# Patient Record
Sex: Male | Born: 1935 | Race: White | Hispanic: No | Marital: Married | Smoking: Former smoker
Health system: Southern US, Community
[De-identification: ages and names within clinical notes are randomized; demographics above are authoritative.]

## PROBLEM LIST (undated history)

## (undated) DIAGNOSIS — J449 Chronic obstructive pulmonary disease, unspecified: Secondary | ICD-10-CM

## (undated) DIAGNOSIS — D171 Benign lipomatous neoplasm of skin and subcutaneous tissue of trunk: Secondary | ICD-10-CM

## (undated) DIAGNOSIS — E119 Type 2 diabetes mellitus without complications: Secondary | ICD-10-CM

## (undated) DIAGNOSIS — F329 Major depressive disorder, single episode, unspecified: Secondary | ICD-10-CM

## (undated) DIAGNOSIS — Z87898 Personal history of other specified conditions: Secondary | ICD-10-CM

## (undated) DIAGNOSIS — K2980 Duodenitis without bleeding: Secondary | ICD-10-CM

## (undated) DIAGNOSIS — C801 Malignant (primary) neoplasm, unspecified: Secondary | ICD-10-CM

## (undated) DIAGNOSIS — Z8719 Personal history of other diseases of the digestive system: Secondary | ICD-10-CM

## (undated) DIAGNOSIS — G450 Vertebro-basilar artery syndrome: Secondary | ICD-10-CM

## (undated) DIAGNOSIS — K589 Irritable bowel syndrome without diarrhea: Secondary | ICD-10-CM

## (undated) DIAGNOSIS — J3089 Other allergic rhinitis: Secondary | ICD-10-CM

## (undated) DIAGNOSIS — J0191 Acute recurrent sinusitis, unspecified: Secondary | ICD-10-CM

## (undated) DIAGNOSIS — E785 Hyperlipidemia, unspecified: Secondary | ICD-10-CM

## (undated) DIAGNOSIS — R413 Other amnesia: Secondary | ICD-10-CM

## (undated) DIAGNOSIS — I2510 Atherosclerotic heart disease of native coronary artery without angina pectoris: Secondary | ICD-10-CM

## (undated) DIAGNOSIS — F32A Depression, unspecified: Secondary | ICD-10-CM

## (undated) DIAGNOSIS — Z950 Presence of cardiac pacemaker: Secondary | ICD-10-CM

## (undated) DIAGNOSIS — I509 Heart failure, unspecified: Secondary | ICD-10-CM

## (undated) DIAGNOSIS — E538 Deficiency of other specified B group vitamins: Secondary | ICD-10-CM

## (undated) DIAGNOSIS — I429 Cardiomyopathy, unspecified: Secondary | ICD-10-CM

## (undated) DIAGNOSIS — Z8639 Personal history of other endocrine, nutritional and metabolic disease: Secondary | ICD-10-CM

## (undated) DIAGNOSIS — D649 Anemia, unspecified: Secondary | ICD-10-CM

## (undated) DIAGNOSIS — H47019 Ischemic optic neuropathy, unspecified eye: Secondary | ICD-10-CM

## (undated) HISTORY — DX: Irritable bowel syndrome without diarrhea: K58.9

## (undated) HISTORY — DX: Cardiomyopathy, unspecified: I42.9

## (undated) HISTORY — DX: Personal history of other endocrine, nutritional and metabolic disease: Z86.39

## (undated) HISTORY — DX: Acute recurrent sinusitis, unspecified: J01.91

## (undated) HISTORY — DX: Personal history of other specified conditions: Z87.898

## (undated) HISTORY — DX: Other allergic rhinitis: J30.89

## (undated) HISTORY — PX: COLONOSCOPY: SHX174

## (undated) HISTORY — DX: Type 2 diabetes mellitus without complications: E11.9

## (undated) HISTORY — DX: Presence of cardiac pacemaker: Z95.0

## (undated) HISTORY — DX: Deficiency of other specified B group vitamins: E53.8

## (undated) HISTORY — DX: Benign lipomatous neoplasm of skin and subcutaneous tissue of trunk: D17.1

## (undated) HISTORY — DX: Vertebro-basilar artery syndrome: G45.0

## (undated) HISTORY — DX: Anemia, unspecified: D64.9

## (undated) HISTORY — DX: Duodenitis without bleeding: K29.80

## (undated) HISTORY — DX: Depression, unspecified: F32.A

## (undated) HISTORY — DX: Ischemic optic neuropathy, unspecified eye: H47.019

## (undated) HISTORY — DX: Other amnesia: R41.3

## (undated) HISTORY — PX: MOHS SURGERY: SHX181

## (undated) HISTORY — DX: Personal history of other diseases of the digestive system: Z87.19

## (undated) HISTORY — DX: Major depressive disorder, single episode, unspecified: F32.9

## (undated) HISTORY — DX: Hyperlipidemia, unspecified: E78.5

## (undated) HISTORY — DX: Atherosclerotic heart disease of native coronary artery without angina pectoris: I25.10

---

## 1990-05-02 HISTORY — PX: CARDIAC CATHETERIZATION: SHX172

## 1990-06-02 HISTORY — PX: ESOPHAGEAL DILATION: SHX303

## 2003-10-04 ENCOUNTER — Ambulatory Visit: Payer: Self-pay | Admitting: Gastroenterology

## 2004-07-17 ENCOUNTER — Ambulatory Visit: Payer: Self-pay | Admitting: Cardiology

## 2006-12-24 ENCOUNTER — Ambulatory Visit: Payer: Self-pay | Admitting: Gastroenterology

## 2007-01-05 ENCOUNTER — Other Ambulatory Visit: Payer: Self-pay

## 2007-01-05 ENCOUNTER — Inpatient Hospital Stay: Payer: Self-pay | Admitting: Internal Medicine

## 2008-03-31 ENCOUNTER — Ambulatory Visit: Payer: Self-pay | Admitting: Specialist

## 2008-07-27 HISTORY — PX: LIPOMA EXCISION: SHX5283

## 2008-09-11 ENCOUNTER — Inpatient Hospital Stay: Payer: Self-pay | Admitting: Internal Medicine

## 2009-04-01 ENCOUNTER — Ambulatory Visit: Payer: Self-pay

## 2009-04-29 ENCOUNTER — Ambulatory Visit: Payer: Self-pay | Admitting: General Practice

## 2009-05-06 ENCOUNTER — Ambulatory Visit: Payer: Self-pay | Admitting: General Practice

## 2009-05-06 HISTORY — PX: KNEE ARTHROSCOPY: SUR90

## 2011-09-04 ENCOUNTER — Encounter: Payer: Self-pay | Admitting: Otolaryngology

## 2011-10-02 ENCOUNTER — Encounter: Payer: Self-pay | Admitting: Otolaryngology

## 2011-11-02 ENCOUNTER — Encounter: Payer: Self-pay | Admitting: Otolaryngology

## 2012-01-02 HISTORY — PX: CATARACT EXTRACTION, BILATERAL: SHX1313

## 2013-04-27 ENCOUNTER — Emergency Department: Payer: Self-pay | Admitting: Emergency Medicine

## 2014-01-01 DIAGNOSIS — C801 Malignant (primary) neoplasm, unspecified: Secondary | ICD-10-CM

## 2014-01-01 HISTORY — DX: Malignant (primary) neoplasm, unspecified: C80.1

## 2016-01-18 ENCOUNTER — Other Ambulatory Visit: Payer: Self-pay | Admitting: Specialist

## 2016-01-18 DIAGNOSIS — R0602 Shortness of breath: Secondary | ICD-10-CM

## 2016-01-20 ENCOUNTER — Ambulatory Visit
Admission: RE | Admit: 2016-01-20 | Discharge: 2016-01-20 | Disposition: A | Discharge status: Home / Self Care | Payer: Medicare PPO | Source: Ambulatory Visit | Attending: Interventional Radiology | Admitting: Interventional Radiology

## 2016-01-20 ENCOUNTER — Ambulatory Visit
Admission: RE | Admit: 2016-01-20 | Discharge: 2016-01-20 | Disposition: A | Discharge status: Home / Self Care | Payer: Medicare PPO | Source: Ambulatory Visit | Attending: Specialist | Admitting: Specialist

## 2016-01-20 DIAGNOSIS — J9811 Atelectasis: Secondary | ICD-10-CM | POA: Insufficient documentation

## 2016-01-20 DIAGNOSIS — J90 Pleural effusion, not elsewhere classified: Secondary | ICD-10-CM | POA: Diagnosis not present

## 2016-01-20 DIAGNOSIS — Z9889 Other specified postprocedural states: Secondary | ICD-10-CM | POA: Diagnosis not present

## 2016-01-20 DIAGNOSIS — R0602 Shortness of breath: Secondary | ICD-10-CM | POA: Diagnosis not present

## 2016-01-20 DIAGNOSIS — I517 Cardiomegaly: Secondary | ICD-10-CM | POA: Diagnosis not present

## 2016-01-20 LAB — PROTEIN, BODY FLUID: Total protein, fluid: <3 g/dL

## 2016-01-20 LAB — GLUCOSE, SEROUS FLUID: Glucose, Fluid: 128 mg/dL

## 2016-01-20 LAB — LACTATE DEHYDROGENASE, PLEURAL OR PERITONEAL FLUID: LD FL: 44 U/L — AB (ref 3–23)

## 2016-01-21 LAB — BODY FLUID CELL COUNT WITH DIFFERENTIAL
Eos, Fluid: 0 %
LYMPHS FL: 81 %
Monocyte-Macrophage-Serous Fluid: 15 %
NEUTROPHIL FLUID: 4 %
Total Nucleated Cell Count, Fluid: 227 cu mm

## 2016-01-21 LAB — MISC LABCORP TEST (SEND OUT): LABCORP TEST CODE: 19588

## 2016-01-23 LAB — BODY FLUID CULTURE: Culture: NO GROWTH

## 2016-01-24 LAB — CYTOLOGY - NON PAP

## 2016-02-17 LAB — FUNGAL ORGANISM REFLEX

## 2016-02-17 LAB — FUNGUS CULTURE RESULT

## 2016-02-17 LAB — FUNGUS CULTURE WITH STAIN

## 2016-03-14 LAB — ACID FAST SMEAR (AFB, MYCOBACTERIA): Acid Fast Smear: NEGATIVE

## 2016-03-14 LAB — ACID FAST CULTURE WITH REFLEXED SENSITIVITIES (MYCOBACTERIA): Acid Fast Culture: NEGATIVE

## 2016-08-17 DIAGNOSIS — Z950 Presence of cardiac pacemaker: Secondary | ICD-10-CM

## 2016-08-17 HISTORY — DX: Presence of cardiac pacemaker: Z95.0

## 2016-12-10 ENCOUNTER — Inpatient Hospital Stay
Admission: EM | Admit: 2016-12-10 | Discharge: 2016-12-13 | DRG: 536 | Disposition: A | Discharge status: Skilled Nursing Facility | Payer: Medicare PPO | Attending: Internal Medicine | Admitting: Internal Medicine

## 2016-12-10 ENCOUNTER — Encounter: Payer: Self-pay | Admitting: Emergency Medicine

## 2016-12-10 ENCOUNTER — Emergency Department: Payer: Medicare PPO

## 2016-12-10 ENCOUNTER — Other Ambulatory Visit: Payer: Self-pay

## 2016-12-10 DIAGNOSIS — W010XXA Fall on same level from slipping, tripping and stumbling without subsequent striking against object, initial encounter: Secondary | ICD-10-CM | POA: Diagnosis present

## 2016-12-10 DIAGNOSIS — Z7984 Long term (current) use of oral hypoglycemic drugs: Secondary | ICD-10-CM

## 2016-12-10 DIAGNOSIS — S5002XA Contusion of left elbow, initial encounter: Secondary | ICD-10-CM | POA: Diagnosis present

## 2016-12-10 DIAGNOSIS — S32412A Displaced fracture of anterior wall of left acetabulum, initial encounter for closed fracture: Secondary | ICD-10-CM | POA: Diagnosis not present

## 2016-12-10 DIAGNOSIS — T50995A Adverse effect of other drugs, medicaments and biological substances, initial encounter: Secondary | ICD-10-CM | POA: Diagnosis present

## 2016-12-10 DIAGNOSIS — M542 Cervicalgia: Secondary | ICD-10-CM | POA: Diagnosis present

## 2016-12-10 DIAGNOSIS — S5001XA Contusion of right elbow, initial encounter: Secondary | ICD-10-CM | POA: Diagnosis present

## 2016-12-10 DIAGNOSIS — I952 Hypotension due to drugs: Secondary | ICD-10-CM | POA: Diagnosis present

## 2016-12-10 DIAGNOSIS — J449 Chronic obstructive pulmonary disease, unspecified: Secondary | ICD-10-CM | POA: Diagnosis present

## 2016-12-10 DIAGNOSIS — E860 Dehydration: Secondary | ICD-10-CM | POA: Diagnosis present

## 2016-12-10 DIAGNOSIS — E119 Type 2 diabetes mellitus without complications: Secondary | ICD-10-CM | POA: Diagnosis present

## 2016-12-10 DIAGNOSIS — I9589 Other hypotension: Secondary | ICD-10-CM | POA: Diagnosis present

## 2016-12-10 DIAGNOSIS — S32435A Nondisplaced fracture of anterior column [iliopubic] of left acetabulum, initial encounter for closed fracture: Secondary | ICD-10-CM

## 2016-12-10 DIAGNOSIS — I5022 Chronic systolic (congestive) heart failure: Secondary | ICD-10-CM | POA: Diagnosis present

## 2016-12-10 DIAGNOSIS — R51 Headache: Secondary | ICD-10-CM | POA: Diagnosis present

## 2016-12-10 DIAGNOSIS — S72009A Fracture of unspecified part of neck of unspecified femur, initial encounter for closed fracture: Secondary | ICD-10-CM | POA: Diagnosis present

## 2016-12-10 DIAGNOSIS — Z79899 Other long term (current) drug therapy: Secondary | ICD-10-CM | POA: Diagnosis not present

## 2016-12-10 DIAGNOSIS — S32592A Other specified fracture of left pubis, initial encounter for closed fracture: Secondary | ICD-10-CM | POA: Diagnosis present

## 2016-12-10 DIAGNOSIS — I110 Hypertensive heart disease with heart failure: Secondary | ICD-10-CM | POA: Diagnosis present

## 2016-12-10 DIAGNOSIS — J9611 Chronic respiratory failure with hypoxia: Secondary | ICD-10-CM | POA: Diagnosis present

## 2016-12-10 DIAGNOSIS — M25512 Pain in left shoulder: Secondary | ICD-10-CM | POA: Diagnosis present

## 2016-12-10 DIAGNOSIS — Z9181 History of falling: Secondary | ICD-10-CM

## 2016-12-10 DIAGNOSIS — I428 Other cardiomyopathies: Secondary | ICD-10-CM | POA: Diagnosis present

## 2016-12-10 DIAGNOSIS — E871 Hypo-osmolality and hyponatremia: Secondary | ICD-10-CM | POA: Diagnosis present

## 2016-12-10 DIAGNOSIS — Z9981 Dependence on supplemental oxygen: Secondary | ICD-10-CM

## 2016-12-10 DIAGNOSIS — R531 Weakness: Secondary | ICD-10-CM

## 2016-12-10 DIAGNOSIS — R296 Repeated falls: Secondary | ICD-10-CM | POA: Diagnosis present

## 2016-12-10 HISTORY — DX: Heart failure, unspecified: I50.9

## 2016-12-10 HISTORY — DX: Type 2 diabetes mellitus without complications: E11.9

## 2016-12-10 HISTORY — DX: Chronic obstructive pulmonary disease, unspecified: J44.9

## 2016-12-10 HISTORY — DX: Malignant (primary) neoplasm, unspecified: C80.1

## 2016-12-10 LAB — CBC WITH DIFFERENTIAL/PLATELET
BASOS PCT: 0 %
Basophils Absolute: 0 10*3/uL (ref 0–0.1)
EOS ABS: 0.1 10*3/uL (ref 0–0.7)
EOS PCT: 2 %
HCT: 29.7 % — ABNORMAL LOW (ref 40.0–52.0)
Hemoglobin: 9.8 g/dL — ABNORMAL LOW (ref 13.0–18.0)
Lymphocytes Relative: 4 %
Lymphs Abs: 0.3 10*3/uL — ABNORMAL LOW (ref 1.0–3.6)
MCH: 29.7 pg (ref 26.0–34.0)
MCHC: 33 g/dL (ref 32.0–36.0)
MCV: 90 fL (ref 80.0–100.0)
MONO ABS: 0.3 10*3/uL (ref 0.2–1.0)
MONOS PCT: 4 %
NEUTROS PCT: 90 %
Neutro Abs: 7.7 10*3/uL — ABNORMAL HIGH (ref 1.4–6.5)
PLATELETS: 129 10*3/uL — AB (ref 150–440)
RBC: 3.3 MIL/uL — ABNORMAL LOW (ref 4.40–5.90)
RDW: 14.8 % — AB (ref 11.5–14.5)
WBC: 8.5 10*3/uL (ref 3.8–10.6)

## 2016-12-10 LAB — CK: CK TOTAL: 33 U/L — AB (ref 49–397)

## 2016-12-10 LAB — URINALYSIS, COMPLETE (UACMP) WITH MICROSCOPIC
BILIRUBIN URINE: NEGATIVE
Bacteria, UA: NONE SEEN
Glucose, UA: >=500 mg/dL — AB
Hgb urine dipstick: NEGATIVE
KETONES UR: NEGATIVE mg/dL
Leukocytes, UA: NEGATIVE
Nitrite: NEGATIVE
PH: 5 (ref 5.0–8.0)
Protein, ur: NEGATIVE mg/dL
SPECIFIC GRAVITY, URINE: 1.008 (ref 1.005–1.030)
Squamous Epithelial / LPF: NONE SEEN

## 2016-12-10 LAB — COMPREHENSIVE METABOLIC PANEL
ALBUMIN: 3.7 g/dL (ref 3.5–5.0)
ALT: 16 U/L — ABNORMAL LOW (ref 17–63)
ANION GAP: 11 (ref 5–15)
AST: 21 U/L (ref 15–41)
Alkaline Phosphatase: 50 U/L (ref 38–126)
BUN: 23 mg/dL — ABNORMAL HIGH (ref 6–20)
CO2: 20 mmol/L — AB (ref 22–32)
Calcium: 8.8 mg/dL — ABNORMAL LOW (ref 8.9–10.3)
Chloride: 96 mmol/L — ABNORMAL LOW (ref 101–111)
Creatinine, Ser: 1.41 mg/dL — ABNORMAL HIGH (ref 0.61–1.24)
GFR calc Af Amer: 53 mL/min — ABNORMAL LOW (ref >60)
GFR calc non Af Amer: 46 mL/min — ABNORMAL LOW (ref >60)
Glucose, Bld: 225 mg/dL — ABNORMAL HIGH (ref 65–99)
POTASSIUM: 4.3 mmol/L (ref 3.5–5.1)
SODIUM: 127 mmol/L — AB (ref 135–145)
Total Bilirubin: 0.4 mg/dL (ref 0.3–1.2)
Total Protein: 6.5 g/dL (ref 6.5–8.1)

## 2016-12-10 LAB — LACTIC ACID, PLASMA: Lactic Acid, Venous: 2.2 mmol/L (ref 0.5–1.9)

## 2016-12-10 LAB — GLUCOSE, CAPILLARY: Glucose-Capillary: 105 mg/dL — ABNORMAL HIGH (ref 65–99)

## 2016-12-10 LAB — SURGICAL PCR SCREEN
MRSA, PCR: NEGATIVE
STAPHYLOCOCCUS AUREUS: NEGATIVE

## 2016-12-10 LAB — TROPONIN I

## 2016-12-10 MED ORDER — PANTOPRAZOLE SODIUM 40 MG PO TBEC: 40.0000 mg | DELAYED_RELEASE_TABLET | Freq: Every day | ORAL | Status: DC

## 2016-12-10 MED ORDER — ACETAMINOPHEN 650 MG RE SUPP: 650.0000 mg | Freq: Four times a day (QID) | RECTAL | Status: DC | PRN

## 2016-12-10 MED ORDER — PANTOPRAZOLE SODIUM 40 MG PO TBEC
40.0000 mg | DELAYED_RELEASE_TABLET | Freq: Every day | ORAL | Status: DC
  Administered 2016-12-10: 40 mg via ORAL
  Filled 2016-12-10: qty 1

## 2016-12-10 MED ORDER — POTASSIUM CHLORIDE CRYS ER 10 MEQ PO TBCR
10.0000 meq | EXTENDED_RELEASE_TABLET | Freq: Two times a day (BID) | ORAL | Status: DC
  Administered 2016-12-10 – 2016-12-11 (×3): 10 meq via ORAL
  Filled 2016-12-10 (×3): qty 1

## 2016-12-10 MED ORDER — POLYETHYLENE GLYCOL 3350 17 G PO PACK
17.0000 g | PACK | Freq: Every day | ORAL | Status: DC | PRN
  Administered 2016-12-13: 17 g via ORAL
  Filled 2016-12-10: qty 1

## 2016-12-10 MED ORDER — SODIUM CHLORIDE 0.9 % IV BOLUS (SEPSIS)
250.0000 mL | Freq: Once | INTRAVENOUS | Status: AC
  Administered 2016-12-10: 250 mL via INTRAVENOUS

## 2016-12-10 MED ORDER — METOPROLOL SUCCINATE ER 50 MG PO TB24
50.0000 mg | ORAL_TABLET | Freq: Every day | ORAL | Status: DC
  Administered 2016-12-10: 50 mg via ORAL
  Filled 2016-12-10 (×2): qty 1

## 2016-12-10 MED ORDER — FLUTICASONE PROPIONATE 50 MCG/ACT NA SUSP
2.0000 | Freq: Every day | NASAL | Status: DC
  Administered 2016-12-11 – 2016-12-12 (×2): 2 via NASAL
  Filled 2016-12-10: qty 16

## 2016-12-10 MED ORDER — MORPHINE SULFATE (PF) 2 MG/ML IV SOLN: 1.0000 mg | INTRAVENOUS | Status: DC | PRN

## 2016-12-10 MED ORDER — ACETAMINOPHEN 325 MG PO TABS
650.0000 mg | ORAL_TABLET | Freq: Four times a day (QID) | ORAL | Status: DC | PRN
  Administered 2016-12-11 – 2016-12-13 (×3): 650 mg via ORAL
  Filled 2016-12-10 (×3): qty 2

## 2016-12-10 MED ORDER — LATANOPROST 0.005 % OP SOLN
1.0000 [drp] | Freq: Every day | OPHTHALMIC | Status: DC
  Administered 2016-12-10 – 2016-12-12 (×3): 1 [drp] via OPHTHALMIC
  Filled 2016-12-10: qty 2.5

## 2016-12-10 MED ORDER — OXYCODONE HCL 5 MG PO TABS
5.0000 mg | ORAL_TABLET | ORAL | Status: DC | PRN
  Administered 2016-12-10 – 2016-12-13 (×9): 5 mg via ORAL
  Filled 2016-12-10 (×9): qty 1

## 2016-12-10 MED ORDER — FLUTICASONE PROPIONATE 50 MCG/ACT NA SUSP
1.0000 | Freq: Every day | NASAL | Status: DC | PRN
  Filled 2016-12-10: qty 16

## 2016-12-10 MED ORDER — ONDANSETRON HCL 4 MG/2ML IJ SOLN: 4.0000 mg | Freq: Four times a day (QID) | INTRAMUSCULAR | Status: DC | PRN

## 2016-12-10 MED ORDER — CLONAZEPAM 0.5 MG PO TABS
0.5000 mg | ORAL_TABLET | Freq: Two times a day (BID) | ORAL | Status: DC | PRN
  Administered 2016-12-10 – 2016-12-12 (×2): 0.5 mg via ORAL
  Filled 2016-12-10 (×2): qty 1

## 2016-12-10 MED ORDER — SODIUM CHLORIDE 0.9 % IV SOLN
INTRAVENOUS | Status: AC
  Administered 2016-12-10: 75 mL/h via INTRAVENOUS
  Administered 2016-12-11: 13:00:00 via INTRAVENOUS

## 2016-12-10 MED ORDER — ONDANSETRON HCL 4 MG PO TABS: 4.0000 mg | ORAL_TABLET | Freq: Four times a day (QID) | ORAL | Status: DC | PRN

## 2016-12-10 MED ORDER — ALBUTEROL SULFATE (2.5 MG/3ML) 0.083% IN NEBU: 3.0000 mL | INHALATION_SOLUTION | Freq: Four times a day (QID) | RESPIRATORY_TRACT | Status: DC | PRN

## 2016-12-10 MED ORDER — ACETAMINOPHEN 10 MG/ML IV SOLN
1000.0000 mg | Freq: Four times a day (QID) | INTRAVENOUS | Status: AC
  Administered 2016-12-10 – 2016-12-11 (×4): 1000 mg via INTRAVENOUS
  Filled 2016-12-10 (×8): qty 100

## 2016-12-10 MED ORDER — DOCUSATE SODIUM 100 MG PO CAPS
100.0000 mg | ORAL_CAPSULE | Freq: Two times a day (BID) | ORAL | Status: DC
  Administered 2016-12-10 – 2016-12-13 (×5): 100 mg via ORAL
  Filled 2016-12-10 (×5): qty 1

## 2016-12-10 MED ORDER — METOPROLOL SUCCINATE ER 50 MG PO TB24: 50.0000 mg | ORAL_TABLET | Freq: Every day | ORAL | Status: DC

## 2016-12-10 MED ORDER — LISINOPRIL 5 MG PO TABS
5.0000 mg | ORAL_TABLET | Freq: Every day | ORAL | Status: DC
  Administered 2016-12-10 – 2016-12-12 (×3): 5 mg via ORAL
  Filled 2016-12-10 (×4): qty 1

## 2016-12-10 MED ORDER — SERTRALINE HCL 50 MG PO TABS: 50.0000 mg | ORAL_TABLET | Freq: Every day | ORAL | Status: DC

## 2016-12-10 MED ORDER — MOMETASONE FURO-FORMOTEROL FUM 200-5 MCG/ACT IN AERO
2.0000 | INHALATION_SPRAY | Freq: Two times a day (BID) | RESPIRATORY_TRACT | Status: DC
  Administered 2016-12-10 – 2016-12-12 (×4): 2 via RESPIRATORY_TRACT
  Filled 2016-12-10: qty 8.8

## 2016-12-10 MED ORDER — SIMVASTATIN 20 MG PO TABS
40.0000 mg | ORAL_TABLET | Freq: Every day | ORAL | Status: DC
  Administered 2016-12-10 – 2016-12-12 (×3): 40 mg via ORAL
  Filled 2016-12-10 (×3): qty 2

## 2016-12-10 MED ORDER — SPIRONOLACTONE 25 MG PO TABS
25.0000 mg | ORAL_TABLET | Freq: Every day | ORAL | Status: DC
  Administered 2016-12-11: 25 mg via ORAL
  Filled 2016-12-10: qty 1

## 2016-12-10 MED ORDER — LISINOPRIL 5 MG PO TABS: 5.0000 mg | ORAL_TABLET | Freq: Every day | ORAL | Status: DC

## 2016-12-10 MED ORDER — SERTRALINE HCL 50 MG PO TABS
50.0000 mg | ORAL_TABLET | Freq: Every day | ORAL | Status: DC
  Administered 2016-12-10 – 2016-12-12 (×3): 50 mg via ORAL
  Filled 2016-12-10 (×3): qty 1

## 2016-12-10 NOTE — ED Triage Notes (Signed)
Pt ems from home s/p fall last night. Pt c/o left hip pain only with movement. Per ems legs are equal length but here left leg seems longer. Pt states he has been weaker and has been falling because his legs give out. Pt a/o. On 2 LNC all the time.

## 2016-12-10 NOTE — ED Provider Notes (Addendum)
[facility] Emergency Department Provider Note    First MD Initiated Contact with Patient 12/10/16 1413     (approximate)  I have reviewed the triage vital signs and the nursing notes.   HISTORY  Chief Complaint Fall and Hip Pain    HPI Ruben Flores is a 81 y.o. male with a history of COPD as well as CHF on 2 L nasal cannula at home for chronic respiratory failure with hypoxia with no recent admissions presents after mechanical fall around 1 AM last night.  Patient states he was walking the restroom and his left leg gave out and he was unable to ambulate.  Did have some left leg pain.  He was able to crawl to another room and call for his wife.  Spent most of the early morning laying on the floor.  States he has been feeling more weak.  Denies any numbness or tingling.  He is not on any blood thinners.  Did not hit his head.  Does have some mild left shoulder pain where he also fell.  No recent antibiotic use.  No abdominal pain, shortness of breath or chest pain.  Past Medical History:  Diagnosis Date  . Cancer ([place]) 2016   skin  . CHF (congestive heart failure) ([place])   . COPD (chronic obstructive pulmonary disease) ([place])   . Diabetes mellitus without complication ([place])    History reviewed. No pertinent family history. History reviewed. No pertinent surgical history. There are no active problems to display for this patient.     Prior to Admission medications   Medication Sig Start Date End Date Taking? Authorizing Provider  albuterol (PROVENTIL HFA;VENTOLIN HFA) 108 (90 Base) MCG/ACT inhaler Inhale 2 puffs into the lungs every 6 (six) hours as needed for wheezing or shortness of breath.   Yes [provider]  clonazePAM (KLONOPIN) 0.5 MG tablet Take 0.5 mg by mouth 2 (two) times daily as needed for anxiety.   Yes [provider]  fluticasone (FLONASE) 50 MCG/ACT nasal spray Place 2 sprays into both nostrils daily.   Yes [provider]  fluticasone-salmeterol (ADVAIR HFA) 230-21 MCG/ACT inhaler Inhale 2 puffs into the lungs 2 (two) times daily.   Yes [provider]  furosemide (LASIX) 40 MG tablet Take 40 mg by mouth daily.   Yes [provider]  latanoprost (XALATAN) 0.005 % ophthalmic solution Place 1 drop into both eyes at bedtime.   Yes [provider]  lisinopril (PRINIVIL,ZESTRIL) 5 MG tablet Take 5 mg by mouth daily.   Yes [provider]  metFORMIN (GLUCOPHAGE) 500 MG tablet Take 1,000 mg by mouth daily.   Yes [provider]  metoprolol succinate (TOPROL-XL) 50 MG 24 hr tablet Take 50 mg by mouth daily. Take with or immediately following a meal.   Yes [provider]  omeprazole (PRILOSEC) 20 MG capsule Take 20 mg by mouth daily.   Yes [provider]  potassium chloride (K-DUR,KLOR-CON) 10 MEQ tablet Take 10 mEq by mouth 2 (two) times daily.   Yes [provider]  sertraline (ZOLOFT) 50 MG tablet Take 50 mg by mouth daily.   Yes [provider]  simvastatin (ZOCOR) 40 MG tablet Take 40 mg by mouth daily.   Yes [provider]  spironolactone (ALDACTONE) 25 MG tablet Take 25 mg by mouth daily.   Yes [provider]    Allergies Patient has no known allergies.    Social History Social History  Tobacco Use  . Smoking status: Never Smoker  . Smokeless tobacco: Never Used  Substance Use Topics  . Alcohol use: No    Frequency: Never  . Drug use: Not on file    Review of Systems Patient denies headaches, rhinorrhea, blurry vision, numbness, shortness of breath, chest pain, edema, cough, abdominal pain, nausea, vomiting, diarrhea, dysuria, fevers, rashes or hallucinations unless otherwise stated above in HPI. ____________________________________________   PHYSICAL EXAM:  VITAL SIGNS: Vitals:   12/10/16 1600 12/10/16 1644  BP: 108/60 121/70  Pulse: 80 77  Resp: 15 13  Temp:    SpO2: 100%  98%    Constitutional: Alert and oriented.  in no acute distress. Eyes: Conjunctivae are normal.  Head: Atraumatic. Nose: No congestion/rhinnorhea. Mouth/Throat: Mucous membranes are moist.   Neck: No stridor. Painless ROM.  Cardiovascular: Normal rate, regular rhythm. Grossly normal heart sounds.  Good peripheral circulation. Respiratory: Normal respiratory effort.  No retractions. Lungs CTAB. Gastrointestinal: Soft and nontender. No distention. No abdominal bruits. No CVA tenderness. Musculoskeletal: .  No deformity or bruising noted to left shoulder.  pain with log roll of left hip, left leg is slightly longer that right.  No effusions or other noted deformity Neurologic:  Normal speech and language. No gross focal neurologic deficits are appreciated. No facial droop Skin:  Skin is warm, dry and intact. No rash noted. Psychiatric: Mood and affect are normal. Speech and behavior are normal.  ____________________________________________   LABS (all labs ordered are listed, but only abnormal results are displayed)  Results for orders placed or performed during the hospital encounter of 12/10/16 (from the past 24 hour(s))  CBC with Differential/Platelet     Status: Abnormal   Collection Time: 12/10/16  2:26 PM  Result Value Ref Range   WBC 8.5 3.8 - 10.6 K/uL   RBC 3.30 (L) 4.40 - 5.90 MIL/uL   Hemoglobin 9.8 (L) 13.0 - 18.0 g/dL   HCT 29.7 (L) 40.0 - 52.0 %   MCV 90.0 80.0 - 100.0 fL   MCH 29.7 26.0 - 34.0 pg   MCHC 33.0 32.0 - 36.0 g/dL   RDW 14.8 (H) 11.5 - 14.5 %   Platelets 129 (L) 150 - 440 K/uL   Neutrophils Relative % 90 %   Neutro Abs 7.7 (H) 1.4 - 6.5 K/uL   Lymphocytes Relative 4 %   Lymphs Abs 0.3 (L) 1.0 - 3.6 K/uL   Monocytes Relative 4 %   Monocytes Absolute 0.3 0.2 - 1.0 K/uL   Eosinophils Relative 2 %   Eosinophils Absolute 0.1 0 - 0.7 K/uL   Basophils Relative 0 %   Basophils Absolute 0.0 0 - 0.1 K/uL  Comprehensive metabolic panel     Status: Abnormal    Collection Time: 12/10/16  2:26 PM  Result Value Ref Range   Sodium 127 (L) 135 - 145 mmol/L   Potassium 4.3 3.5 - 5.1 mmol/L   Chloride 96 (L) 101 - 111 mmol/L   CO2 20 (L) 22 - 32 mmol/L   Glucose, Bld 225 (H) 65 - 99 mg/dL   BUN 23 (H) 6 - 20 mg/dL   Creatinine, Ser 1.41 (H) 0.61 - 1.24 mg/dL   Calcium 8.8 (L) 8.9 - 10.3 mg/dL   Total Protein 6.5 6.5 - 8.1 g/dL   Albumin 3.7 3.5 - 5.0 g/dL   AST 21 15 - 41 U/L   ALT 16 (L) 17 - 63 U/L   Alkaline Phosphatase 50 38 - 126 U/L  Total Bilirubin 0.4 0.3 - 1.2 mg/dL   GFR calc non Af Amer 46 (L) >60 mL/min   GFR calc Af Amer 53 (L) >60 mL/min   Anion gap 11 5 - 15  Lactic acid, plasma     Status: Abnormal   Collection Time: 12/10/16  2:26 PM  Result Value Ref Range   Lactic Acid, Venous 2.2 (HH) 0.5 - 1.9 mmol/L  CK     Status: Abnormal   Collection Time: 12/10/16  2:26 PM  Result Value Ref Range   Total CK 33 (L) 49 - 397 U/L  Troponin I     Status: None   Collection Time: 12/10/16  2:26 PM  Result Value Ref Range   Troponin I <0.03 <0.03 ng/mL  Urinalysis, Complete w Microscopic     Status: Abnormal   Collection Time: 12/10/16  3:20 PM  Result Value Ref Range   Color, Urine YELLOW (A) YELLOW   APPearance CLEAR (A) CLEAR   Specific Gravity, Urine 1.008 1.005 - 1.030   pH 5.0 5.0 - 8.0   Glucose, UA >=500 (A) NEGATIVE mg/dL   Hgb urine dipstick NEGATIVE NEGATIVE   Bilirubin Urine NEGATIVE NEGATIVE   Ketones, ur NEGATIVE NEGATIVE mg/dL   Protein, ur NEGATIVE NEGATIVE mg/dL   Nitrite NEGATIVE NEGATIVE   Leukocytes, UA NEGATIVE NEGATIVE   RBC / HPF 0-5 0 - 5 RBC/hpf   WBC, UA 0-5 0 - 5 WBC/hpf   Bacteria, UA NONE SEEN NONE SEEN   Squamous Epithelial / LPF NONE SEEN NONE SEEN   Hyaline Casts, UA PRESENT    ____________________________________________  EKG My review and personal interpretation at Time: 14:31   Indication: fall  Rate: 80  Rhythm: sinus with occasional v paced complexes Axis: left Other: normal  intervals, lbbb with paced rhythm ____________________________________________  RADIOLOGY  I personally reviewed all radiographic images ordered to evaluate for the above acute complaints and reviewed radiology reports and findings.  These findings were personally discussed with the patient.  Please see medical record for radiology report.  ____________________________________________   PROCEDURES  Procedure(s) performed:  Procedures    Critical Care performed: no ____________________________________________   INITIAL IMPRESSION / ASSESSMENT AND PLAN / ED COURSE  Pertinent labs & imaging results that were available during my care of the patient were reviewed by me and considered in my medical decision making (see chart for details).  DDX: Dehydration, sepsis, pna, uti, hypoglycemia, fracture,d islocation, contusion  TAELON BENDORF is a 81 y.o. who presents to the ED with symptoms as described above.  He is afebrile and hemodynamically stable but does appear weak.  No lateralizing features or head injury to suggest central nervous system insult.  X-rays will be ordered due to concern for fracture.  Blood work will be sent for the above differential.  Clinical Course as of Dec 11 1727  Mon Dec 10, 2016  1610 Pelvis x-ray is normal but the patient with persistent pain on palpation.  Based on his age and risk factor will order CT pelvis to exclude occult fracture.  Will give gentle IV hydration for his hyponatremia and probable dehydration.  [PR]  1716 Patient does have evidence of acetabular fracture.  Based on symptoms and persistent pain.  Patient will require admission the hospital.  [PR]  1727 Spoke with Dr. Mack Guise orthopedics regarding the patient's fracture pattern.  He agrees with admission the hospital for evaluation of operative fixation.  [PR]    Clinical Course User Index [PR] Quentin Cornwall,  Saralyn Pilar, MD     ____________________________________________   FINAL  CLINICAL IMPRESSION(S) / ED DIAGNOSES  Final diagnoses:  Closed nondisplaced fracture of anterior column of left acetabulum, initial encounter ([place])  Hyponatremia  Weakness      NEW MEDICATIONS STARTED DURING THIS VISIT:  This SmartLink is deprecated. Use AVSMEDLIST instead to display the medication list for a patient.   Note:  This document was prepared using Dragon voice recognition software and may include unintentional dictation errors.    Merlyn Lot, MD 12/10/16 1729    Merlyn Lot, MD 12/23/16 [phone]

## 2016-12-10 NOTE — H&P (Signed)
[facility] [place] NAME: Ruben Flores    MR#:  [id number]  DATE OF BIRTH:  11/19/1935  DATE OF ADMISSION:  12/10/2016  PRIMARY CARE PHYSICIAN: Derinda Late, MD   REQUESTING/REFERRING PHYSICIAN: Dr. Clearnce Hasten  CHIEF COMPLAINT:  Fall at home  HISTORY OF PRESENT ILLNESS:  Ruben Flores  is a 81 y.o. male with a known history of cardiomyopathy EF of 20-25%, diabetes, COPD on home oxygen comes to the emergency room by EMS accompanied by wife after he had a mechanical fall at home.  According to the wife patient has been falling lately at home.  He has bruised skin over the elbow and knees.  He had a fall yesterday started having left hip pain came to the emergency room was found to have Acute, comminuted, left anterior wall fracture of the left acetabulum also involving the high left superior pubic ramus  Patient denies any chest pain shortness of breath.  He wears chronic oxygen.  He is being admitted with further evaluation and management.  Dr. Mack Guise orthopedic aware of pt being admitted area PAST MEDICAL HISTORY:   Past Medical History:  Diagnosis Date  . Cancer ([place]) 2016   skin  . CHF (congestive heart failure) ([place])   . COPD (chronic obstructive pulmonary disease) ([place])   . Diabetes mellitus without complication ([place])     PAST SURGICAL HISTOIRY:  History reviewed. No pertinent surgical history.  SOCIAL HISTORY:   Social History   Tobacco Use  . Smoking status: Never Smoker  . Smokeless tobacco: Never Used  Substance Use Topics  . Alcohol use: No    Frequency: Never    FAMILY HISTORY:  History reviewed. No pertinent family history.  DRUG ALLERGIES:  No Known Allergies  REVIEW OF SYSTEMS:  Review of Systems  Constitutional: Negative for chills, fever and weight loss.  HENT: Negative for ear discharge, ear pain and nosebleeds.   Eyes: Negative for blurred vision, pain and discharge.  Respiratory:  Positive for shortness of breath. Negative for wheezing and stridor.   Cardiovascular: Negative for chest pain, palpitations, orthopnea and PND.  Gastrointestinal: Negative for abdominal pain, diarrhea, nausea and vomiting.  Genitourinary: Negative for frequency and urgency.  Musculoskeletal: Positive for falls and joint pain. Negative for back pain.  Neurological: Positive for weakness. Negative for sensory change, speech change and focal weakness.  Psychiatric/Behavioral: Negative for depression and hallucinations. The patient is not nervous/anxious.      MEDICATIONS AT HOME:   Prior to Admission medications   Medication Sig Start Date End Date Taking? Authorizing Provider  albuterol (PROVENTIL HFA;VENTOLIN HFA) 108 (90 Base) MCG/ACT inhaler Inhale 2 puffs into the lungs every 6 (six) hours as needed for wheezing or shortness of breath.   Yes [provider]  clonazePAM (KLONOPIN) 0.5 MG tablet Take 0.5 mg by mouth 2 (two) times daily as needed for anxiety.   Yes [provider]  fluticasone (FLONASE) 50 MCG/ACT nasal spray Place 2 sprays into both nostrils daily.   Yes [provider]  fluticasone-salmeterol (ADVAIR HFA) 230-21 MCG/ACT inhaler Inhale 2 puffs into the lungs 2 (two) times daily.   Yes [provider]  furosemide (LASIX) 40 MG tablet Take 40 mg by mouth daily.   Yes [provider]  latanoprost (XALATAN) 0.005 % ophthalmic solution Place 1 drop into both eyes at bedtime.   Yes [provider]  lisinopril (PRINIVIL,ZESTRIL) 5 MG tablet Take 5 mg by mouth daily.  Yes [provider]  metFORMIN (GLUCOPHAGE) 500 MG tablet Take 1,000 mg by mouth daily.   Yes [provider]  metoprolol succinate (TOPROL-XL) 50 MG 24 hr tablet Take 50 mg by mouth daily. Take with or immediately following a meal.   Yes [provider]  omeprazole (PRILOSEC) 20 MG capsule Take 20 mg by mouth daily.   Yes [provider]  potassium chloride (K-DUR,KLOR-CON) 10 MEQ tablet Take 10 mEq by mouth 2 (two) times daily.   Yes [provider]  sertraline (ZOLOFT) 50 MG tablet Take 50 mg by mouth daily.   Yes [provider]  simvastatin (ZOCOR) 40 MG tablet Take 40 mg by mouth daily.   Yes [provider]  spironolactone (ALDACTONE) 25 MG tablet Take 25 mg by mouth daily.   Yes [provider]      VITAL SIGNS:  Blood pressure 116/75, pulse 86, temperature 98.5 F (36.9 C), temperature source Oral, resp. rate 14, height 5\' 10"  (1.778 m), weight 59 kg (130 lb), SpO2 93 %.  PHYSICAL EXAMINATION:  GENERAL:  81 y.o.-year-old patient lying in the bed with no acute distress.  Thin cachectic EYES: Pupils equal, round, reactive to light and accommodation. No scleral icterus. Extraocular muscles intact.  HEENT: Head atraumatic, normocephalic. Oropharynx and nasopharynx clear.  NECK:  Supple, no jugular venous distention. No thyroid enlargement, no tenderness.  LUNGS: Normal breath sounds bilaterally, no wheezing, rales,rhonchi or crepitation. No use of accessory muscles of respiration.  CARDIOVASCULAR: S1, S2 normal. No murmurs, rubs, or gallops.  ABDOMEN: Soft, nontender, nondistended. Bowel sounds present. No organomegaly or mass.  EXTREMITIES: No pedal edema, cyanosis, or clubbing.  Restricted range of motion at left hip NEUROLOGIC: Cranial nerves II through XII are intact. Muscle strength 5/5 in all extremities. Sensation intact. Gait not checked.  PSYCHIATRIC: The patient is alert and oriented x 3.  SKIN:  Bruises over the elbows bilaterally LABORATORY PANEL:   CBC Recent Labs  Lab 12/10/16 1426  WBC 8.5  HGB 9.8*  HCT 29.7*  PLT 129*   ------------------------------------------------------------------------------------------------------------------  Chemistries  Recent Labs  Lab 12/10/16 1426  NA 127*  K 4.3  CL 96*  CO2 20*  GLUCOSE 225*  BUN 23*   CREATININE 1.41*  CALCIUM 8.8*  AST 21  ALT 16*  ALKPHOS 50  BILITOT 0.4   ------------------------------------------------------------------------------------------------------------------  Cardiac Enzymes Recent Labs  Lab 12/10/16 1426  TROPONINI <0.03   ------------------------------------------------------------------------------------------------------------------  RADIOLOGY:  Ct Pelvis Wo Contrast  Result Date: 12/10/2016 CLINICAL DATA:  Patient slipped and fell last evening complaining of left hip pain with movement. EXAM: CT PELVIS WITHOUT CONTRAST TECHNIQUE: Multidetector CT imaging of the pelvis was performed following the standard protocol without intravenous contrast. COMPARISON:  12/10/2016 pelvis and left hip radiographs. FINDINGS: Urinary Tract:  Intact urinary bladder.  No distal uropathy. Bowel:  Unremarkable visualized pelvic bowel loops. Vascular/Lymphatic: No pathologically enlarged lymph nodes. No significant vascular abnormality seen. Reproductive:  Prostate and seminal vesicles are unremarkable. Other:  No free air free fluid. Musculoskeletal: An acute, closed, coronal fracture of the acetabulum, medial wall of the acetabular and high left superior pubic ramus are noted in keeping with an anterior wall fracture of the left acetabulum. No extension into the iliac bone nor involvement of the obturator ring. No fracture of the proximal femora. IMPRESSION: Acute, comminuted, left anterior wall fracture of the left acetabulum also involving the high left superior pubic ramus. No significant displacement, joint effusion or proximal femoral  fracture. Electronically Signed   By: Ashley Royalty M.D.   On: 12/10/2016 17:12   Dg Chest Portable 1 View  Result Date: 12/10/2016 CLINICAL DATA:  Left shoulder and left hip pain after fall. EXAM: PORTABLE CHEST 1 VIEW COMPARISON:  01/20/2016 FINDINGS: Left chest wall pacer device is noted with lead in the right atrial appendage and  right ventricle. Normal heart size. Aortic atherosclerosis. No pleural effusion or edema. No airspace opacities. IMPRESSION: 1. No acute cardiopulmonary abnormalities. 2.  Aortic Atherosclerosis (ICD10-I70.0). Electronically Signed   By: Kerby Moors M.D.   On: 12/10/2016 15:48   Dg Shoulder Left  Result Date: 12/10/2016 CLINICAL DATA:  Recent fall with left shoulder pain, initial encounter EXAM: LEFT SHOULDER - 2+ VIEW COMPARISON:  None. FINDINGS: Degenerative changes of the acromioclavicular joint are noted. A pacing device is seen. No acute fracture or dislocation is noted. No soft tissue abnormality is seen. IMPRESSION: Degenerative change without acute abnormality. Electronically Signed   By: Inez Catalina M.D.   On: 12/10/2016 15:44   Dg Hip Unilat W Or Wo Pelvis 2-3 Views Left  Result Date: 12/10/2016 CLINICAL DATA:  Left hip pain after fall. EXAM: DG HIP (WITH OR WITHOUT PELVIS) 2-3V LEFT COMPARISON:  None. FINDINGS: There is no evidence of hip fracture or dislocation. There is no evidence of arthropathy or other focal bone abnormality. IMPRESSION: Negative. Electronically Signed   By: Misty Stanley M.D.   On: 12/10/2016 15:41    EKG:    IMPRESSION AND PLAN:    Ruben Flores  is a 81 y.o. male with a known history of cardiomyopathy EF of 20-25%, diabetes, COPD on home oxygen comes to the emergency room by EMS accompanied by wife after he had a mechanical fall at home.  According to the wife patient has been falling lately at home.  He has bruised skin over the elbow and knees.  He had a fall yesterday started having left hip pain came to the emergency room was found to have Acute, comminuted, left anterior wall fracture of the left acetabulum also involving the high left superior pubic ramus  1.  Acute community left anterior wall fracture of the acetabulum and left superior pubic ramus fracture status post mechanical fall at home -Admit to orthopedic -IV fluids, IV as needed pain  meds -Spoke with Dr. Mack Guise.  Patient is at a moderate/intermediate risk for any surgery given his chronic medical problems  2.  Cardiomyopathy with EF of 20-25% -I's and O's -pt's volume status appears euvolemic  3.  Hypertension continue home meds  4.  Diabetes sliding scale insulin  5.  DVT prophylaxis SCD for now.  Patient is pending surgery since orthopedic as to make a decision yet.  Discussed with patient and wife  All the records are reviewed and case discussed with ED provider. Management plans discussed with the patient, family and they are in agreement.  CODE STATUS: Full  TOTAL TIME TAKING CARE OF THIS PATIENT: *50* minutes.    Fritzi Mandes M.D on 12/10/2016 at 7:38 PM  Between 7am to 6pm - Pager - [phone]  After 6pm go to [email or web address] - password [email or web address] [facility]  [facility]  Office  [phone]  CC: Primary care physician; Derinda Late, MD

## 2016-12-11 ENCOUNTER — Encounter
Admission: RE | Admit: 2016-12-11 | Discharge: 2016-12-11 | Disposition: A | Discharge status: Home / Self Care | Payer: Medicare HMO | Source: Ambulatory Visit | Attending: Internal Medicine | Admitting: Internal Medicine

## 2016-12-11 ENCOUNTER — Other Ambulatory Visit: Payer: Self-pay

## 2016-12-11 LAB — GLUCOSE, CAPILLARY: Glucose-Capillary: 131 mg/dL — ABNORMAL HIGH (ref 65–99)

## 2016-12-11 MED ORDER — INSULIN ASPART 100 UNIT/ML ~~LOC~~ SOLN
0.0000 [IU] | Freq: Four times a day (QID) | SUBCUTANEOUS | Status: DC
  Administered 2016-12-11: 1 [IU] via SUBCUTANEOUS
  Filled 2016-12-11: qty 1

## 2016-12-11 MED ORDER — ENOXAPARIN SODIUM 40 MG/0.4ML ~~LOC~~ SOLN
40.0000 mg | SUBCUTANEOUS | Status: DC
  Administered 2016-12-11 – 2016-12-12 (×2): 40 mg via SUBCUTANEOUS
  Filled 2016-12-11 (×2): qty 0.4

## 2016-12-11 MED ORDER — PANTOPRAZOLE SODIUM 40 MG PO TBEC
40.0000 mg | DELAYED_RELEASE_TABLET | Freq: Every day | ORAL | Status: DC
  Administered 2016-12-11 – 2016-12-13 (×3): 40 mg via ORAL
  Filled 2016-12-11 (×3): qty 1

## 2016-12-11 NOTE — Progress Notes (Signed)
PT Cancellation Note  Patient Details Name: Ruben Flores MRN: [id number] DOB: 05-17-1935   Cancelled Treatment:    Reason Eval/Treat Not Completed: Other (comment) waiting for ortho consult and pt eating later in day Ortho consult does not indicate surgery, pt will need to be TTWBing.  When PT arrived to room he had just gotten his meal and as he had been NPO all day was very hungry.  Discussed with pt/son that we will be by in the AM for PT exam, he says "You can wake me up."    Kreg Shropshire, DPT 12/11/2016, 5:12 PM

## 2016-12-11 NOTE — Clinical Social Work Placement (Signed)
   CLINICAL SOCIAL WORK PLACEMENT  NOTE  Date:  12/11/2016  Patient Details  Name: Ruben Flores MRN: [id number] Date of Birth: [birth year]  Clinical Social Work is seeking post-discharge placement for this patient at the [place] level of care (*CSW will initial, date and re-position this form in  chart as items are completed):  Yes   Patient/family provided with [facility] Work Department's list of facilities offering this level of care within the geographic area requested by the patient (or if unable, by the patient's family).  Yes   Patient/family informed of their freedom to choose among providers that offer the needed level of care, that participate in Medicare, Medicaid or managed care program needed by the patient, have an available bed and are willing to accept the patient.  Yes   Patient/family informed of [facility]'s ownership interest in [facility] and [facility], as well as of the fact that they are under no obligation to receive care at these facilities.  PASRR submitted to EDS on 12/11/16     PASRR number received on       Existing PASRR number confirmed on       FL2 transmitted to all facilities in geographic area requested by pt/family on 12/11/16     FL2 transmitted to all facilities within larger geographic area on       Patient informed that his/her managed care company has contracts with or will negotiate with certain facilities, including the following:            Patient/family informed of bed offers received.  Patient chooses bed at       Physician recommends and patient chooses bed at      Patient to be transferred to   on  .  Patient to be transferred to facility by       Patient family notified on   of transfer.  Name of family member notified:        PHYSICIAN       Additional Comment:    _______________________________________________ Deaundra Dupriest, Veronia Beets, LCSW 12/11/2016, 2:17 PM

## 2016-12-11 NOTE — Progress Notes (Signed)
Per Ortho MD patient will not have surgery and will need rehab at a SNF. Clinical Education officer, museum (CSW) presented bed offers to patient and his son Elisandro, they chose [facility]. CSW will fax clinicals into [facility] health once PT note is available. CSW made [facility] admissions coordinator at [facility] aware of above.  [employer], LCSW [phone]

## 2016-12-11 NOTE — Progress Notes (Signed)
New Admission Note:   Arrival Method: per stretcher from ED, pt came from home after a fall Mental Orientation: alert and oriented X4 Telemetry: none ordered, pt with pacemaker on the left upper chest Assessment: Completed Skin: warm, dry, with scattered ecchymosis noted on both arms, with skin tear noted on the right elbow, prophylactic sacral foam applied. IV: G20 on the right forearm with transparent dressing, intact, flushed. Pain: 3/10 scale on the left hip, pt refused pain medicine this time, repositioned comfortably on bed Tubes: O2 inhalation at 2Lpm via nasal cannula, chronic Safety Measures: Safety Fall Prevention Plan has been given and discussed Admission: Completed 1A Orientation: Patient has been oriented to the room, unit and staff.  Family: wife [name], son Carlynn Herald and daughter in law at bedside.  Orders have been reviewed and implemented. Call light has been placed within reach, yellow socks on, fall risk armband applied, bed alarm activated. Will continue to monitor patient.   Georgeanna Harrison, RN

## 2016-12-11 NOTE — Clinical Social Work Note (Signed)
Clinical Social Work Assessment  Patient Details  Name: Ruben Flores MRN: [id number] Date of Birth: Oct 09, 1935  Date of referral:  12/11/16               Reason for consult:  Facility Placement                Permission sought to share information with:  Chartered certified accountant granted to share information::  Yes, Verbal Permission Granted  Name::      Ruben Flores::   Ruben Flores   Relationship::     Contact Information:     Housing/Transportation Living arrangements for the past 2 months:  [place] of Information:  Patient, Adult Children, Spouse Patient Interpreter Needed:  None Criminal Activity/Legal Involvement Pertinent to Current Situation/Hospitalization:  No - Comment as needed Significant Relationships:  Adult Children, Spouse Lives with:  Spouse Do you feel safe going back to the place where you live?  Yes Need for family participation in patient care:  Yes (Comment)  Care giving concerns:  Patient lives in [place] with his wife Ruben Flores.    Social Worker assessment / plan:  Holiday representative ([place]) reviewed chart and noted that patient has a hip fracture. CSW met with patient and his wife Ruben Flores and son Ruben Flores were at bedside. CSW introduced self and explained role of CSW department. Patient was laying in the bed and was alert and oriented X4. CSW introduced self and explained role of CSW department. Patient reported that he lives in [place] with his wife Ruben Flores. CSW explained that ortho consult is pending. CSW also explained that PT will evaluate patient and make a recommendation of home health or SNF. CSW explained that Ruben Flores will have to approve SNF. Patient is agreeable to SNF search in Ruben Flores. Patient is agreeable to SNF if needed. Patient prefers Ruben Flores. FL2 complete and faxed out. CSW will continue to follow and assist as needed.   Employment status:  Retired Designer, industrial/product PT Recommendations:  Not assessed at this time Ruben Flores / Referral to community resources:  Ruben Flores  Patient/Family's Response to care:  Patient is agreeable to Ruben Flores in Ruben Flores.   Patient/Family's Understanding of and Emotional Response to Diagnosis, Current Treatment, and Prognosis:  Patient and his son were very pleasant and thanked CSW for assistance.   Emotional Assessment Appearance:  Appears stated age Attitude/Demeanor/Rapport:    Affect (typically observed):  Accepting, Adaptable, Pleasant Orientation:  Oriented to Self, Oriented to Place, Oriented to  Time, Oriented to Situation Alcohol / Substance use:  Not Applicable Psych involvement (Current and /or in the community):  No (Comment)  Discharge Needs  Concerns to be addressed:  Discharge Planning Concerns Readmission within the last 30 days:  No Current discharge risk:  Dependent with Mobility Barriers to Discharge:  Continued Medical Work up   [employer], Veronia Beets, LCSW 12/11/2016, 2:18 PM

## 2016-12-11 NOTE — NC FL2 (Signed)
[place] LEVEL OF CARE SCREENING TOOL     IDENTIFICATION  Patient Name: Ruben Flores Birthdate: [birth year] Sex: male Admission Date (Current Location): 12/10/2016  [place] and [place] Number:  Engineering geologist and Address:  [facility], [address], [place], [place] [address]      Provider Number: [id number]  Attending Physician Name and Address:  Max Sane, MD  Relative Name and Phone Number:       Current Level of Care: Hospital Recommended Level of Care: [place] Prior Approval Number:    Date Approved/Denied:   PASRR Number:   [id number]  Discharge Plan: SNF    Current Diagnoses: Patient Active Problem List   Diagnosis Date Noted  . Hip fracture ([place]) 12/10/2016    Orientation RESPIRATION BLADDER Height & Weight     Self, Time, Situation, Place  O2(2 Liters Oxygen. ) Continent Weight: 127 lb 1.6 oz (57.7 kg) Height:  5\' 9"  (175.3 cm)  BEHAVIORAL SYMPTOMS/MOOD NEUROLOGICAL BOWEL NUTRITION STATUS      Continent Diet(Diet: NPO for surgery. )  AMBULATORY STATUS COMMUNICATION OF NEEDS Skin   Extensive Assist Verbally Surgical wounds                       Personal Care Assistance Level of Assistance  Bathing, Feeding, Dressing Bathing Assistance: Limited assistance Feeding assistance: Independent Dressing Assistance: Limited assistance     Functional Limitations Info  Sight, Hearing, Speech Sight Info: Adequate Hearing Info: Adequate Speech Info: Adequate    SPECIAL CARE FACTORS FREQUENCY  PT (By licensed PT), OT (By licensed OT)     PT Frequency: (5) OT Frequency: (5)            Contractures      Additional Factors Info  Code Status, Allergies Code Status Info: (Full Code. ) Allergies Info: (No Known Allergies. )           Current Medications (12/11/2016):  This is the current hospital active medication list Current Facility-Administered Medications  Medication  Dose Route Frequency Provider Last Rate Last Dose  . 0.9 %  sodium chloride infusion   Intravenous Continuous Fritzi Mandes, MD 75 mL/hr at 12/11/16 1258    . acetaminophen (OFIRMEV) IV 1,000 mg  1,000 mg Intravenous Q6H Merlyn Lot, MD   Stopped at 12/11/16 [phone]  . acetaminophen (TYLENOL) tablet 650 mg  650 mg Oral Q6H PRN Fritzi Mandes, MD       Or  . acetaminophen (TYLENOL) suppository 650 mg  650 mg Rectal Q6H PRN Fritzi Mandes, MD      . albuterol (PROVENTIL) (2.5 MG/3ML) 0.083% nebulizer solution 3 mL  3 mL Inhalation Q6H PRN Fritzi Mandes, MD      . clonazePAM Bobbye [place]) tablet 0.5 mg  0.5 mg Oral BID PRN Fritzi Mandes, MD   0.5 mg at 12/10/16 2227  . docusate sodium (COLACE) capsule 100 mg  100 mg Oral BID Fritzi Mandes, MD   100 mg at 12/10/16 2106  . fluticasone (FLONASE) 50 MCG/ACT nasal spray 1 spray  1 spray Each Nare Daily PRN Fritzi Mandes, MD      . fluticasone (FLONASE) 50 MCG/ACT nasal spray 2 spray  2 spray Each Nare Daily Fritzi Mandes, MD      . insulin aspart (novoLOG) injection 0-9 Units  0-9 Units Subcutaneous Q6H Shah, Vipul, MD      . latanoprost (XALATAN) 0.005 % ophthalmic solution 1 drop  1 drop Both  Eyes QHS Fritzi Mandes, MD   1 drop at 12/10/16 2106  . lisinopril (PRINIVIL,ZESTRIL) tablet 5 mg  5 mg Oral Daily Fritzi Mandes, MD   5 mg at 12/10/16 2106  . mometasone-formoterol (DULERA) 200-5 MCG/ACT inhaler 2 puff  2 puff Inhalation BID Fritzi Mandes, MD   2 puff at 12/10/16 2107  . morphine 2 MG/ML injection 1-2 mg  1-2 mg Intravenous Q4H PRN Fritzi Mandes, MD      . ondansetron [facility]) tablet 4 mg  4 mg Oral Q6H PRN Fritzi Mandes, MD       Or  . ondansetron [facility]) injection 4 mg  4 mg Intravenous Q6H PRN Fritzi Mandes, MD      . oxyCODONE (Oxy IR/ROXICODONE) immediate release tablet 5 mg  5 mg Oral Q4H PRN Fritzi Mandes, MD   5 mg at 12/11/16 1006  . pantoprazole (PROTONIX) EC tablet 40 mg  40 mg Oral QAC breakfast Fritzi Mandes, MD   40 mg at 12/10/16 2106  . polyethylene glycol  (MIRALAX / GLYCOLAX) packet 17 g  17 g Oral Daily PRN Fritzi Mandes, MD      . potassium chloride (K-DUR,KLOR-CON) CR tablet 10 mEq  10 mEq Oral BID Fritzi Mandes, MD   10 mEq at 12/11/16 1007  . sertraline (ZOLOFT) tablet 50 mg  50 mg Oral Daily Fritzi Mandes, MD   50 mg at 12/10/16 2106  . simvastatin (ZOCOR) tablet 40 mg  40 mg Oral Daily Fritzi Mandes, MD   40 mg at 12/10/16 2106     Discharge Medications: Please see discharge summary for a list of discharge medications.  Relevant Imaging Results:  Relevant Lab Results:   Additional Information (SSN: [id number])  Karthika Glasper, Veronia Beets, LCSW

## 2016-12-11 NOTE — Progress Notes (Signed)
[place] [facility] [place] NAME: Ruben Flores    MR#:  [id number]  DATE OF BIRTH:  [birth year]  SUBJECTIVE:  CHIEF COMPLAINT:   Chief Complaint  Patient presents with  . Fall  . Hip Pain  Hypotensive. patient and family waiting for orthopedic evaluation and seems frustrated REVIEW OF SYSTEMS:  Review of Systems  Constitutional: Negative for chills, fever and weight loss.  HENT: Negative for nosebleeds and sore throat.   Eyes: Negative for blurred vision.  Respiratory: Negative for cough, shortness of breath and wheezing.   Cardiovascular: Negative for chest pain, orthopnea, leg swelling and PND.  Gastrointestinal: Negative for abdominal pain, constipation, diarrhea, heartburn, nausea and vomiting.  Genitourinary: Negative for dysuria and urgency.  Musculoskeletal: Positive for falls and joint pain. Negative for back pain.  Skin: Negative for rash.  Neurological: Negative for dizziness, speech change, focal weakness and headaches.  Endo/Heme/Allergies: Does not bruise/bleed easily.  Psychiatric/Behavioral: Negative for depression.    DRUG ALLERGIES:  No Known Allergies VITALS:  Blood pressure (!) 92/52, pulse 72, temperature 98.6 F (37 C), temperature source Oral, resp. rate 16, height 5\' 9"  (1.753 m), weight 57.7 kg (127 lb 1.6 oz), SpO2 97 %. PHYSICAL EXAMINATION:  Physical Exam  Constitutional: He is oriented to person, place, and time and well-developed, well-nourished, and in no distress.  HENT:  Head: Normocephalic and atraumatic.  Eyes: Conjunctivae and EOM are normal. Pupils are equal, round, and reactive to light.  Neck: Normal range of motion. Neck supple. No tracheal deviation present. No thyromegaly present.  Cardiovascular: Normal rate, regular rhythm and normal heart sounds.  Pulmonary/Chest: Effort normal and breath sounds normal. No respiratory distress. He has no wheezes. He exhibits no tenderness.  Abdominal:  Soft. Bowel sounds are normal. He exhibits no distension. There is no tenderness.  Musculoskeletal:       Left hip: He exhibits decreased range of motion, decreased strength and tenderness.  Neurological: He is alert and oriented to person, place, and time. No cranial nerve deficit.  Skin: Skin is warm and dry. No rash noted.  Psychiatric: Mood and affect normal.   LABORATORY PANEL:  Male CBC Recent Labs  Lab 12/10/16 1426  WBC 8.5  HGB 9.8*  HCT 29.7*  PLT 129*   ------------------------------------------------------------------------------------------------------------------ Chemistries  Recent Labs  Lab 12/10/16 1426  NA 127*  K 4.3  CL 96*  CO2 20*  GLUCOSE 225*  BUN 23*  CREATININE 1.41*  CALCIUM 8.8*  AST 21  ALT 16*  ALKPHOS 50  BILITOT 0.4   RADIOLOGY:  No results found. ASSESSMENT AND PLAN:  Ruben Flores  is a 81 y.o. male with a known history of cardiomyopathy EF of 20-25%, diabetes, COPD on home oxygen comes to the emergency room by EMS accompanied by wife after he had a mechanical fall at home.  According to the wife patient has been falling lately at home.  He has bruised skin over the elbow and knees.  He had a fall yesterday started having left hip pain came to the emergency room was found to have Acute, comminuted, left anterior wall fracture of the left acetabulum also involving the high left superior pubic ramus  1.  Acute Left minimally displaced acetabular fracture -Ortho recommends conservative management - Patient will be touchdown weightbearing on the left lower extremity for up to 12 weeks. Patient will require a walker for short distances and a wheelchair for  long distances. Patient has issues with balance and has had multiple falls at home. He is a significant fall risk at home. Patient will require a skilled nursing facility stay upon discharge.   2.  Cardiomyopathy with EF of 20-25% -well compensated at this time.  3.  Hypertension  continue lisinopril  4.  Diabetes sliding scale insulin  5.    Hyponatremia: Gentle IV hydration and monitor sodium  DVT prophylaxis: Lovenox 40 mg subcu daily       All the records are reviewed and case discussed with Care Management/Social Worker. Management plans discussed with the patient, family and they are in agreement.  CODE STATUS: Full Code  TOTAL TIME TAKING CARE OF THIS PATIENT: 35 minutes.   More than 50% of the time was spent in counseling/coordination of care: YES  POSSIBLE D/C IN 1-2 DAYS, DEPENDING ON CLINICAL CONDITION.   Ruben Flores M.D on 12/11/2016 at 5:01 PM  Between 7am to 6pm - Pager - [phone]  After 6pm go to [email or web address] - Proofreader  [employer] [facility]  Office  [phone]  CC: Primary care physician; Ruben Late, MD  Note: This dictation was prepared with Dragon dictation along with smaller phrase technology. Any transcriptional errors that result from this process are unintentional.

## 2016-12-11 NOTE — Consult Note (Signed)
ORTHOPAEDIC CONSULTATION  REQUESTING PHYSICIAN: Max Sane, MD  Chief Complaint: Left hip pain s/p fall  HPI: Ruben Flores is a 81 y.o. male who complains of left hip pain after a fall yesterday.   Patient presented to the [facility] emergency Department after his fall. X-rays revealed a minimally displaced acetabular fracture. He was admitted to the hospitalist service for pain control. I reviewed the x-rays last night and discussed them with Dr. Altamese Wagoner at [facility].   Patient is seen this afternoon with his wife and son at the bedside. Patient is lying in bed and denies any significant pain while at rest. He denies any numbness or tingling in the lower extremities.  Son explains that the patient is O2 dependent at home and has an ejection fraction of approximately 30%.  Patient has had multiple recent falls according to his son.  Past Medical History:  Diagnosis Date  . Cancer ([place]) 2016   skin  . CHF (congestive heart failure) ([place])   . COPD (chronic obstructive pulmonary disease) ([place])   . Diabetes mellitus without complication ([place])    History reviewed. No pertinent surgical history. Social History   Socioeconomic History  . Marital status: Married    Spouse name: None  . Number of children: None  . Years of education: None  . Highest education level: None  Social Needs  . Financial resource strain: None  . Food insecurity - worry: None  . Food insecurity - inability: None  . Transportation needs - medical: None  . Transportation needs - non-medical: None  Occupational History  . None  Tobacco Use  . Smoking status: Never Smoker  . Smokeless tobacco: Never Used  Substance and Sexual Activity  . Alcohol use: No    Frequency: Never  . Drug use: None  . Sexual activity: None  Other Topics Concern  . None  Social History Narrative  . None   History reviewed. No pertinent family history. No Known Allergies Prior to Admission medications    Medication Sig Start Date End Date Taking? Authorizing Provider  albuterol (PROVENTIL HFA;VENTOLIN HFA) 108 (90 Base) MCG/ACT inhaler Inhale 2 puffs into the lungs every 6 (six) hours as needed for wheezing or shortness of breath.   Yes [provider]  clonazePAM (KLONOPIN) 0.5 MG tablet Take 0.5 mg by mouth 2 (two) times daily as needed for anxiety.   Yes [provider]  fluticasone (FLONASE) 50 MCG/ACT nasal spray Place 2 sprays into both nostrils daily.   Yes [provider]  fluticasone-salmeterol (ADVAIR HFA) 230-21 MCG/ACT inhaler Inhale 2 puffs into the lungs 2 (two) times daily.   Yes [provider]  furosemide (LASIX) 40 MG tablet Take 40 mg by mouth daily.   Yes [provider]  latanoprost (XALATAN) 0.005 % ophthalmic solution Place 1 drop into both eyes at bedtime.   Yes [provider]  lisinopril (PRINIVIL,ZESTRIL) 5 MG tablet Take 5 mg by mouth daily.   Yes [provider]  metFORMIN (GLUCOPHAGE) 500 MG tablet Take 1,000 mg by mouth daily.   Yes [provider]  metoprolol succinate (TOPROL-XL) 50 MG 24 hr tablet Take 50 mg by mouth daily. Take with or immediately following a meal.   Yes [provider]  omeprazole (PRILOSEC) 20 MG capsule Take 20 mg by mouth daily.   Yes [provider]  potassium chloride (K-DUR,KLOR-CON) 10 MEQ tablet Take 10 mEq by mouth 2 (two) times daily.   Yes [provider]  sertraline (ZOLOFT) 50 MG tablet Take 50 mg by mouth daily.   Yes [provider]  simvastatin (ZOCOR) 40 MG tablet Take 40 mg by mouth daily.   Yes [provider]  spironolactone (ALDACTONE) 25 MG tablet Take 25 mg by mouth daily.   Yes [provider]   Ct Pelvis Wo Contrast  Result Date: 12/10/2016 CLINICAL DATA:  Patient slipped and fell last evening complaining of left hip pain with movement. EXAM: CT PELVIS WITHOUT CONTRAST TECHNIQUE: Multidetector  CT imaging of the pelvis was performed following the standard protocol without intravenous contrast. COMPARISON:  12/10/2016 pelvis and left hip radiographs. FINDINGS: Urinary Tract:  Intact urinary bladder.  No distal uropathy. Bowel:  Unremarkable visualized pelvic bowel loops. Vascular/Lymphatic: No pathologically enlarged lymph nodes. No significant vascular abnormality seen. Reproductive:  Prostate and seminal vesicles are unremarkable. Other:  No free air free fluid. Musculoskeletal: An acute, closed, coronal fracture of the acetabulum, medial wall of the acetabular and high left superior pubic ramus are noted in keeping with an anterior wall fracture of the left acetabulum. No extension into the iliac bone nor involvement of the obturator ring. No fracture of the proximal femora. IMPRESSION: Acute, comminuted, left anterior wall fracture of the left acetabulum also involving the high left superior pubic ramus. No significant displacement, joint effusion or proximal femoral fracture. Electronically Signed   By: Ashley Royalty M.D.   On: 12/10/2016 17:12   Dg Chest Portable 1 View  Result Date: 12/10/2016 CLINICAL DATA:  Left shoulder and left hip pain after fall. EXAM: PORTABLE CHEST 1 VIEW COMPARISON:  01/20/2016 FINDINGS: Left chest wall pacer device is noted with lead in the right atrial appendage and right ventricle. Normal heart size. Aortic atherosclerosis. No pleural effusion or edema. No airspace opacities. IMPRESSION: 1. No acute cardiopulmonary abnormalities. 2.  Aortic Atherosclerosis (ICD10-I70.0). Electronically Signed   By: Kerby Moors M.D.   On: 12/10/2016 15:48   Dg Shoulder Left  Result Date: 12/10/2016 CLINICAL DATA:  Recent fall with left shoulder pain, initial encounter EXAM: LEFT SHOULDER - 2+ VIEW COMPARISON:  None. FINDINGS: Degenerative changes of the acromioclavicular joint are noted. A pacing device is seen. No acute fracture or dislocation is noted. No soft tissue  abnormality is seen. IMPRESSION: Degenerative change without acute abnormality. Electronically Signed   By: Inez Catalina M.D.   On: 12/10/2016 15:44   Dg Hip Unilat W Or Wo Pelvis 2-3 Views Left  Result Date: 12/10/2016 CLINICAL DATA:  Left hip pain after fall. EXAM: DG HIP (WITH OR WITHOUT PELVIS) 2-3V LEFT COMPARISON:  None. FINDINGS: There is no evidence of hip fracture or dislocation. There is no evidence of arthropathy or other focal bone abnormality. IMPRESSION: Negative. Electronically Signed   By: Misty Stanley M.D.   On: 12/10/2016 15:41    Positive ROS: All other systems have been reviewed and were otherwise negative with the exception of those mentioned in the HPI and as above.  Physical Exam: General: Alert, no acute distress  MUSCULOSKELETAL: Left lower extremity: Patient's skin is intact. There is no erythema or ecchymosis or deformity seen. Patient has no shortening or external rotation to the left lower extremity. He has pain with active hip flexion at approximately 45.  He also has pain with internal or external rotation of the left lower extremity (logrolling).  Patient had palpable pedal pulses, intact sensation light touch intact motor function distally. He can flex and extend his toes and dorsiflex and  plantarflex his ankles bilaterally.  Assessment: Left minimally displaced acetabular fracture  Plan: I reviewed the x-rays with Dr. Altamese  at [facility].  He is in agreement with my plan for nonoperative treatment for this fracture. Patient will be touchdown weightbearing on the left lower extremity for up to 12 weeks. Patient will require a walker for short distances and a wheelchair for long distances. Patient has issues with balance and has had multiple falls at home. He is a significant fall risk at home. Patient will require a skilled nursing facility stay upon discharge. Patient will continue to follow up with me in the office as this fracture heals.  Patient is now ordered for physical therapy and patient's initial evaluation is pending. He is touchdown weightbearing left lower extremity. Patient will be started on medical anticoagulation therapy while an inpatient.    [place], MD    12/11/2016 4:38 PM

## 2016-12-12 LAB — CBC
HEMATOCRIT: 27.3 % — AB (ref 40.0–52.0)
HEMOGLOBIN: 9.3 g/dL — AB (ref 13.0–18.0)
MCH: 30.7 pg (ref 26.0–34.0)
MCHC: 34.1 g/dL (ref 32.0–36.0)
MCV: 90.1 fL (ref 80.0–100.0)
Platelets: 101 10*3/uL — ABNORMAL LOW (ref 150–440)
RBC: 3.03 MIL/uL — AB (ref 4.40–5.90)
RDW: 14.9 % — ABNORMAL HIGH (ref 11.5–14.5)
WBC: 6.9 10*3/uL (ref 3.8–10.6)

## 2016-12-12 LAB — BASIC METABOLIC PANEL
Anion gap: 5 (ref 5–15)
BUN: 16 mg/dL (ref 6–20)
CHLORIDE: 104 mmol/L (ref 101–111)
CO2: 20 mmol/L — AB (ref 22–32)
CREATININE: 0.95 mg/dL (ref 0.61–1.24)
Calcium: 8.7 mg/dL — ABNORMAL LOW (ref 8.9–10.3)
GFR calc non Af Amer: >60 mL/min (ref >60)
Glucose, Bld: 124 mg/dL — ABNORMAL HIGH (ref 65–99)
POTASSIUM: 5.1 mmol/L (ref 3.5–5.1)
Sodium: 129 mmol/L — ABNORMAL LOW (ref 135–145)

## 2016-12-12 LAB — GLUCOSE, CAPILLARY
GLUCOSE-CAPILLARY: 111 mg/dL — AB (ref 65–99)
GLUCOSE-CAPILLARY: 137 mg/dL — AB (ref 65–99)
GLUCOSE-CAPILLARY: 150 mg/dL — AB (ref 65–99)
GLUCOSE-CAPILLARY: 159 mg/dL — AB (ref 65–99)
Glucose-Capillary: 118 mg/dL — ABNORMAL HIGH (ref 65–99)

## 2016-12-12 MED ORDER — FUROSEMIDE 40 MG PO TABS
40.0000 mg | ORAL_TABLET | Freq: Every day | ORAL | Status: DC
  Administered 2016-12-12 – 2016-12-13 (×2): 40 mg via ORAL
  Filled 2016-12-12 (×2): qty 1

## 2016-12-12 MED ORDER — METFORMIN HCL 500 MG PO TABS
1000.0000 mg | ORAL_TABLET | Freq: Every day | ORAL | Status: DC
  Administered 2016-12-12 – 2016-12-13 (×2): 1000 mg via ORAL
  Filled 2016-12-12 (×2): qty 2

## 2016-12-12 MED ORDER — INSULIN ASPART 100 UNIT/ML ~~LOC~~ SOLN: 0.0000 [IU] | Freq: Every day | SUBCUTANEOUS | Status: DC

## 2016-12-12 MED ORDER — INSULIN ASPART 100 UNIT/ML ~~LOC~~ SOLN
0.0000 [IU] | Freq: Three times a day (TID) | SUBCUTANEOUS | Status: DC
  Administered 2016-12-12: 1 [IU] via SUBCUTANEOUS
  Administered 2016-12-12: 2 [IU] via SUBCUTANEOUS
  Administered 2016-12-13: 1 [IU] via SUBCUTANEOUS
  Filled 2016-12-12 (×3): qty 1

## 2016-12-12 NOTE — Progress Notes (Signed)
Subjective:  Hospital day #2 for left acetabular fracture.  Patient reports left hip pain as mild to moderate.  Patient has been able to participate with physical therapy.  Objective:   VITALS:   Vitals:   12/11/16 2049 12/12/16 0429 12/12/16 0646 12/12/16 0737  BP: 134/84 (!) 93/55  (!) 115/48  Pulse: 89 91  (!) 108  Resp: 18 18  18   Temp: 98 F (36.7 C) 98.2 F (36.8 C)  98.6 F (37 C)  TempSrc: Oral Oral  Oral  SpO2: 95% 91%  93%  Weight:   66.2 kg (146 lb)   Height:        PHYSICAL EXAM: Left lower extremity:  Patient has ecchymosis developing over the lateral hip. Skin remains intact. His compartments are soft and compressible. Distally he has neurovascular intact. He has intact motor function in the left lower extremity. Neurovascular intact Sensation intact distally Intact pulses distally Dorsiflexion/Plantar flexion intact No cellulitis present Compartment soft  LABS  Results for orders placed or performed during the hospital encounter of 12/10/16 (from the past 24 hour(s))  Glucose, capillary     Status: Abnormal   Collection Time: 12/11/16  5:37 PM  Result Value Ref Range   Glucose-Capillary 131 (H) 65 - 99 mg/dL  Glucose, capillary     Status: Abnormal   Collection Time: 12/12/16 12:07 AM  Result Value Ref Range   Glucose-Capillary 111 (H) 65 - 99 mg/dL   Comment 1 Notify RN   CBC     Status: Abnormal   Collection Time: 12/12/16  5:18 AM  Result Value Ref Range   WBC 6.9 3.8 - 10.6 K/uL   RBC 3.03 (L) 4.40 - 5.90 MIL/uL   Hemoglobin 9.3 (L) 13.0 - 18.0 g/dL   HCT 27.3 (L) 40.0 - 52.0 %   MCV 90.1 80.0 - 100.0 fL   MCH 30.7 26.0 - 34.0 pg   MCHC 34.1 32.0 - 36.0 g/dL   RDW 14.9 (H) 11.5 - 14.5 %   Platelets 101 (L) 150 - 440 K/uL  Basic metabolic panel     Status: Abnormal   Collection Time: 12/12/16  5:18 AM  Result Value Ref Range   Sodium 129 (L) 135 - 145 mmol/L   Potassium 5.1 3.5 - 5.1 mmol/L   Chloride 104 101 - 111 mmol/L   CO2 20 (L)  22 - 32 mmol/L   Glucose, Bld 124 (H) 65 - 99 mg/dL   BUN 16 6 - 20 mg/dL   Creatinine, Ser 0.95 0.61 - 1.24 mg/dL   Calcium 8.7 (L) 8.9 - 10.3 mg/dL   GFR calc non Af Amer >60 >60 mL/min   GFR calc Af Amer >60 >60 mL/min   Anion gap 5 5 - 15  Glucose, capillary     Status: Abnormal   Collection Time: 12/12/16  6:45 AM  Result Value Ref Range   Glucose-Capillary 118 (H) 65 - 99 mg/dL   Comment 1 Notify RN     Ct Pelvis Wo Contrast  Result Date: 12/10/2016 CLINICAL DATA:  Patient slipped and fell last evening complaining of left hip pain with movement. EXAM: CT PELVIS WITHOUT CONTRAST TECHNIQUE: Multidetector CT imaging of the pelvis was performed following the standard protocol without intravenous contrast. COMPARISON:  12/10/2016 pelvis and left hip radiographs. FINDINGS: Urinary Tract:  Intact urinary bladder.  No distal uropathy. Bowel:  Unremarkable visualized pelvic bowel loops. Vascular/Lymphatic: No pathologically enlarged lymph nodes. No significant vascular abnormality seen. Reproductive:  Prostate  and seminal vesicles are unremarkable. Other:  No free air free fluid. Musculoskeletal: An acute, closed, coronal fracture of the acetabulum, medial wall of the acetabular and high left superior pubic ramus are noted in keeping with an anterior wall fracture of the left acetabulum. No extension into the iliac bone nor involvement of the obturator ring. No fracture of the proximal femora. IMPRESSION: Acute, comminuted, left anterior wall fracture of the left acetabulum also involving the high left superior pubic ramus. No significant displacement, joint effusion or proximal femoral fracture. Electronically Signed   By: Ashley Royalty M.D.   On: 12/10/2016 17:12   Dg Chest Portable 1 View  Result Date: 12/10/2016 CLINICAL DATA:  Left shoulder and left hip pain after fall. EXAM: PORTABLE CHEST 1 VIEW COMPARISON:  01/20/2016 FINDINGS: Left chest wall pacer device is noted with lead in the right  atrial appendage and right ventricle. Normal heart size. Aortic atherosclerosis. No pleural effusion or edema. No airspace opacities. IMPRESSION: 1. No acute cardiopulmonary abnormalities. 2.  Aortic Atherosclerosis (ICD10-I70.0). Electronically Signed   By: Kerby Moors M.D.   On: 12/10/2016 15:48   Dg Shoulder Left  Result Date: 12/10/2016 CLINICAL DATA:  Recent fall with left shoulder pain, initial encounter EXAM: LEFT SHOULDER - 2+ VIEW COMPARISON:  None. FINDINGS: Degenerative changes of the acromioclavicular joint are noted. A pacing device is seen. No acute fracture or dislocation is noted. No soft tissue abnormality is seen. IMPRESSION: Degenerative change without acute abnormality. Electronically Signed   By: Inez Catalina M.D.   On: 12/10/2016 15:44   Dg Hip Unilat W Or Wo Pelvis 2-3 Views Left  Result Date: 12/10/2016 CLINICAL DATA:  Left hip pain after fall. EXAM: DG HIP (WITH OR WITHOUT PELVIS) 2-3V LEFT COMPARISON:  None. FINDINGS: There is no evidence of hip fracture or dislocation. There is no evidence of arthropathy or other focal bone abnormality. IMPRESSION: Negative. Electronically Signed   By: Misty Stanley M.D.   On: 12/10/2016 15:41    Assessment/Plan:     Active Problems:   Hip fracture [facility])  Patient will require a stay at skilled nursing facility for his acetabular fracture. He will need continued physical therapy for gait training and balance, upper and lower extremity strengthening.  Patient is a significant fall risk. Continue physical therapy while he is here. Recommend Lovenox 40 mg daily for DVT prophylaxis. We'll continue to follow while the patient is hospitalized. Currently he is awaiting insurance approval to go to a skilled nursing facility. He has selected [facility].    Thornton Park , MD 12/12/2016, 1:02 PM

## 2016-12-12 NOTE — Progress Notes (Signed)
Clinical Education officer, museum (CSW) faxed in clinicals to [employer] to start insurance authorization for SNF.   [employer], LCSW [phone]

## 2016-12-12 NOTE — Evaluation (Signed)
Physical Therapy Evaluation Patient Details Name: Ruben Flores MRN: [id number] DOB: [birth year] Today's Date: 12/12/2016   History of Present Illness  Ruben Flores a64 y.o.malewith a known history of cardiomyopathy EF of 20-25%, diabetes, COPD on home oxygen comes to the emergency room by EMS accompanied by wife after he had a mechanical fall at home. According to the wife patient has been falling lately at home. He has bruised skin over the elbow and knees. He had a fall yesterday started having left hip pain came to the emergency room was found to have acute, comminuted, left anterior wall fracture of the left acetabulum also involving the high left superior pubic ramus. Patient denies any chest pain shortness of breath. He wears chronic oxygen. Pt seen by orthopedics and is going to be managed non-operatively. He is to be TTWB on LLE. Pt is very motivated to work with physical therapy.   Clinical Impression  Pt admitted with above diagnosis. Pt currently with functional limitations due to the deficits listed below (see PT Problem List).  Pt requires modA+1 for bed mobility today. Pt requires cues for sequencing with bed mobility. HOB elevated. Assist for LLE with abduction when exiting bed to the L side. Bed elevated for transfers. Safe hand placement demonstrated with cues. ModA+2 required to come to standing and minA+1 to balance once in standing with bilateral UE support on rolling walker. Attempted hopping on RLE with bilateral UE support on walker however pt unable to clear RLE from ground. Therapist's foot placed under LLE during hopping attempts and patient attempting to push down with LLE so discontinued. Unable to attempt ambulation at this time. Pt was transferred to recliner with maxA+2 and with patient hopping on RLE. SaO2 drops to 85% on 2L/min and HR increases to 120 bpm. With pursed lip breathing SaO2 recovers to >90% within 30-45s. Pt able to complete all supine/seated  exercises as instructed with great motivation. He is very eager to improve and demonstrates excellent capacity to participate with all exercises and functional mobility to work on returning to baseline function. He would greatly benefit from SNF placement at this time for aggressive therapy in order to improve functional mobility and facilitate safe transition home. Pt will benefit from PT services to address deficits in strength, balance, and mobility in order to return to full function at home.     Follow Up Recommendations SNF    Equipment Recommendations  None recommended by PT;Other (comment)(TBD at next venue)    Recommendations for Other Services       Precautions / Restrictions Precautions Precautions: Fall Restrictions Weight Bearing Restrictions: Yes LLE Weight Bearing: Touchdown weight bearing      Mobility  Bed Mobility Overal bed mobility: Needs Assistance Bed Mobility: Supine to Sit     Supine to sit: Mod assist     General bed mobility comments: Pt requires cues for sequencing with bed mobility. HOB elevated. Assist for LLE with abduction when exiting bed to the L side  Transfers Overall transfer level: Needs assistance Equipment used: Rolling walker (2 wheeled) Transfers: Sit to/from Stand Sit to Stand: Mod assist;+2 physical assistance         General transfer comment: Bed elevated for transfers. Safe hand placement demonstrated with cues. Pt requiring minA+1 to balance once in standing with bilateral UE support on rolling walker. Attempted hopping on RLE with bilateral UE support on walker however pt unable to clear RLE from ground. Therapist foot under LLE with patient attempting to push down with  LLE. Unable to attempt ambulation at this time. Pt was transferred to recliner with maxA+2 and with patient hopping on RLE. SaO2 drops to 85% on 2L/min and HR increases to 120 bpm. With pursed lip breathing SaO2 recovers to >90% within 30-45s  Ambulation/Gait              General Gait Details: Unable to perform at this time  Stairs            Wheelchair Mobility    Modified Rankin (Stroke Patients Only)       Balance Overall balance assessment: Needs assistance Sitting-balance support: No upper extremity supported Sitting balance-Leahy Scale: Good     Standing balance support: Bilateral upper extremity supported Standing balance-Leahy Scale: Poor Standing balance comment: Requiring minA+1 to stabilize in standing                             Pertinent Vitals/Pain Pain Assessment: 0-10 Pain Score: 7  Pain Location: L hip with activity Pain Intervention(s): Monitored during session    Home Living Family/patient expects to be discharged to:: Private residence Living Arrangements: Spouse/significant other Available Help at Discharge: Family Type of Home: Other(Comment)(Condo) Home Access: Stairs to enter Entrance Stairs-Rails: None Entrance Stairs-Number of Steps: 1(The only step is the threshold) Home Layout: One level Home Equipment: Walker - 2 wheels;Cane - single point;Shower seat - built in;Other (comment)(Home O2 at 2L/min, no BSC, no grab bars, no wheelchair,)      Prior Function Level of Independence: Needs assistance   Gait / Transfers Assistance Needed: Pt ambulates with intermittent use of rolling walker. Experiencing more falls lately and has sufferred 4 falls in the last 3-4 months. Prior to that he denies falls  ADL's / Homemaking Assistance Needed: Mostly independent with ADLs but intermittently requiring help from wife. Assist for IADLs        Hand Dominance   Dominant Hand: Right    Extremity/Trunk Assessment   Upper Extremity Assessment Upper Extremity Assessment: Generalized weakness(typically functional but too weak to support full bodyweight)    Lower Extremity Assessment Lower Extremity Assessment: LLE deficits/detail LLE Deficits / Details: Pt able to perform L SLR with 2  finger assist. Full R SLR and bilateral LAQ without assist. Full DF/PF bilaterally. Reports intact sensation to light touch       Communication   Communication: No difficulties  Cognition Arousal/Alertness: Awake/alert Behavior During Therapy: WFL for tasks assessed/performed Overall Cognitive Status: Within Functional Limits for tasks assessed                                 General Comments: AOx2, disoriented to year but oriented to month. This is baseline for patient. He is oriented to situation      General Comments      Exercises General Exercises - Lower Extremity Ankle Circles/Pumps: AROM;Both;10 reps Quad Sets: Strengthening;Both;10 reps Gluteal Sets: Strengthening;Both;10 reps Long Arc Quad: Strengthening;Both;10 reps;Other (comment)(No LLE resistance) Hip ABduction/ADduction: Strengthening;Both;10 reps Straight Leg Raises: Strengthening;Both;10 reps   Assessment/Plan    PT Assessment Patient needs continued PT services  PT Problem List Decreased strength;Decreased activity tolerance;Decreased balance;Decreased mobility;Pain       PT Treatment Interventions DME instruction;Gait training;Stair training;Functional mobility training;Therapeutic activities;Therapeutic exercise;Balance training;Neuromuscular re-education;Patient/family education    PT Goals (Current goals can be found in the Care Plan section)  Acute Rehab PT Goals Patient Stated Goal: Go  to SNF to get strong enough to return home PT Goal Formulation: With patient/family Time For Goal Achievement: 12/26/16 Potential to Achieve Goals: Good    Frequency 7X/week   Barriers to discharge Decreased caregiver support Pt requiring intensive therapy to return to baseline function    Co-evaluation               AM-PAC PT "6 Clicks" Daily Activity  Outcome Measure Difficulty turning over in bed (including adjusting bedclothes, sheets and blankets)?: Unable Difficulty moving from lying  on back to sitting on the side of the bed? : Unable Difficulty sitting down on and standing up from a chair with arms (e.g., wheelchair, bedside commode, etc,.)?: Unable Help needed moving to and from a bed to chair (including a wheelchair)?: A Lot Help needed walking in hospital room?: Total Help needed climbing 3-5 steps with a railing? : Total 6 Click Score: 7    End of Session Equipment Utilized During Treatment: Gait belt;Oxygen Activity Tolerance: Patient tolerated treatment well Patient left: in chair;with chair alarm set;with call bell/phone within reach;with family/visitor present Nurse Communication: Mobility status PT Visit Diagnosis: Unsteadiness on feet (R26.81);Repeated falls (R29.6);Muscle weakness (generalized) (M62.81);Pain Pain - Right/Left: Left Pain - part of body: Hip    Time: 0830-0912 PT Time Calculation (min) (ACUTE ONLY): 42 min   Charges:   PT Evaluation $PT Eval Moderate Complexity: 1 Mod PT Treatments $Therapeutic Exercise: 8-22 mins   PT G Codes:   PT G-Codes **NOT FOR INPATIENT CLASS** Functional Assessment Tool Used: AM-PAC 6 Clicks Basic Mobility Functional Limitation: Mobility: Walking and moving around Mobility: Walking and Moving Around Current Status (I0165): At least 80 percent but less than 100 percent impaired, limited or restricted Mobility: Walking and Moving Around Goal Status [phone]): At least 20 percent but less than 40 percent impaired, limited or restricted    Phillips Grout PT, DPT    Huprich,Jason 12/12/2016, 9:28 AM

## 2016-12-12 NOTE — Progress Notes (Addendum)
[facility] [place] NAME: Tiron Suski    MR#:  [id number]  DATE OF BIRTH:  1935/01/14  SUBJECTIVE:left Hip pain with ambulation, sitting in the chair.  Denies any shortness of breath, chest pain.  Son is at the bedside.  CHIEF COMPLAINT:   Chief Complaint  Patient presents with  . Fall  . Hip Pain  Hypotensive. patient and family waiting for orthopedic evaluation and seems frustrated REVIEW OF SYSTEMS:  Review of Systems  Constitutional: Negative for chills, fever and weight loss.  HENT: Negative for nosebleeds and sore throat.   Eyes: Negative for blurred vision.  Respiratory: Negative for cough, shortness of breath and wheezing.   Cardiovascular: Negative for chest pain, orthopnea, leg swelling and PND.  Gastrointestinal: Negative for abdominal pain, constipation, diarrhea, heartburn, nausea and vomiting.  Genitourinary: Negative for dysuria and urgency.  Musculoskeletal: Positive for falls. Negative for back pain.  Skin: Negative for rash.  Neurological: Negative for dizziness, speech change, focal weakness and headaches.  Endo/Heme/Allergies: Does not bruise/bleed easily.  Psychiatric/Behavioral: Negative for depression.    DRUG ALLERGIES:  No Known Allergies VITALS:  Blood pressure (!) 115/48, pulse (!) 108, temperature 98.6 F (37 C), temperature source Oral, resp. rate 18, height 5\' 9"  (1.753 m), weight 66.2 kg (146 lb), SpO2 93 %. PHYSICAL EXAMINATION:  Physical Exam  Constitutional: He is oriented to person, place, and time and well-developed, well-nourished, and in no distress.  HENT:  Head: Normocephalic and atraumatic.  Eyes: Conjunctivae and EOM are normal. Pupils are equal, round, and reactive to light.  Neck: Normal range of motion. Neck supple. No tracheal deviation present. No thyromegaly present.  Cardiovascular: Normal rate, regular rhythm and normal heart sounds.  Pulmonary/Chest: Effort normal and  breath sounds normal. No respiratory distress. He has no wheezes. He exhibits no tenderness.  Abdominal: Soft. Bowel sounds are normal. He exhibits no distension. There is no tenderness.  Musculoskeletal:       Left hip: He exhibits decreased range of motion, decreased strength and tenderness.  Neurological: He is alert and oriented to person, place, and time. No cranial nerve deficit.  Skin: Skin is warm and dry. No rash noted.  Psychiatric: Mood and affect normal.   LABORATORY PANEL:  Male CBC Recent Labs  Lab 12/12/16 0518  WBC 6.9  HGB 9.3*  HCT 27.3*  PLT 101*   ------------------------------------------------------------------------------------------------------------------ Chemistries  Recent Labs  Lab 12/10/16 1426 12/12/16 0518  NA 127* 129*  K 4.3 5.1  CL 96* 104  CO2 20* 20*  GLUCOSE 225* 124*  BUN 23* 16  CREATININE 1.41* 0.95  CALCIUM 8.8* 8.7*  AST 21  --   ALT 16*  --   ALKPHOS 50  --   BILITOT 0.4  --    RADIOLOGY:  No results found. ASSESSMENT AND PLAN:  Zacherie Honeyman  is a 81 y.o. male with a known history of cardiomyopathy EF of 20-25%, diabetes, COPD on home oxygen comes to the emergency room by EMS accompanied by wife after he had a mechanical fall at home.  According to the wife patient has been falling lately at home.  He has bruised skin over the elbow and knees.  He had a fall yesterday started having left hip pain came to the emergency room was found to have Acute, comminuted, left anterior wall fracture of the left acetabulum also involving the high left superior pubic ramus  1.  Acute Left minimally displaced acetabular fracture -Ortho recommends conservative management, son is aware of this. - Patient will be touchdown weightbearing on the left lower extremity for up to 12 weeks. Patient will require a walker for short distances and a wheelchair for long distances. Patient has issues with balance and has had multiple falls at home. He is a  significant fall risk at home. Patient will require a skilled nursing facility stay upon discharge.  Waiting for insurance approval for him to go to rehab, family chose [facility].  2.  Cardiomyopathy with EF of 20-25% -well compensated at this time. continue Aldactone, resume Lasix today. 3.  Hypertension continue lisinopril  4.  Diabetes ; new sliding scale insulin, restarted metformin .5.    Hyponatremia: Secondary to CHF: Resume Lasix today.. DVT prophylaxis: Lovenox 40 mg subcu daily   disCussed with son and also patient, registered nurse.    All the records are reviewed and case discussed with Care Management/Social Worker. Management plans discussed with the patient, family and they are in agreement.  CODE STATUS: Full Code  TOTAL TIME TAKING CARE OF THIS PATIENT: 35 minutes.   More than 50% of the time was spent in counseling/coordination of care: YES  POSSIBLE D/C IN 1-2 DAYS, DEPENDING ON CLINICAL CONDITION.   Epifanio Lesches M.D on 12/12/2016 at 10:21 AM  Between 7am to 6pm - Pager - [phone]  After 6pm go to [email or web address] - Proofreader  [facility]  Office  [phone]  CC: Primary care physician; Derinda Late, MD  Note: This dictation was prepared with Dragon dictation along with smaller phrase technology. Any transcriptional errors that result from this process are unintentional.

## 2016-12-12 NOTE — Progress Notes (Deleted)
This Probation officer had a conversation with pt's daughter Maudie Mercury at bedside. Pt's daughter requested to treat pt as a FULL code for the meantime, previous code was DNR. Pt's daughter also requested for more information about CPR and code status. Dr. Jannifer Franklin paged and placed an order for full code, routine inpatient spiritual consult also verbally ordered by above MD.

## 2016-12-12 NOTE — Progress Notes (Signed)
[facility] SNF authorization has been received through [facility], auth # [id number], RVC. Plan is for patient to D/C to [facility] tomorrow pending medical clearance. [facility] admissions coordinator at [facility] is aware of above. Patient is aware of above. Clinical Education officer, museum (CSW) left patient's son Ruben Flores a voicemail making him aware of above. CSW will continue to follow and assist as needed.   [employer], LCSW [phone]

## 2016-12-12 NOTE — Progress Notes (Signed)
Physical Therapy Treatment Patient Details Name: MOROCCO GIPE MRN: [id number] DOB: August 17, 1935 Today's Date: 12/12/2016    History of Present Illness WilliamLloydis a76 y.o.malewith a known history of cardiomyopathy EF of 20-25%, diabetes, COPD on home oxygen comes to the emergency room by EMS accompanied by wife after he had a mechanical fall at home. According to the wife patient has been falling lately at home. He has bruised skin over the elbow and knees. He had a fall yesterday started having left hip pain came to the emergency room was found to have acute, comminuted, left anterior wall fracture of the left acetabulum also involving the high left superior pubic ramus. Patient denies any chest pain shortness of breath. He wears chronic oxygen. Pt seen by orthopedics and is going to be managed non-operatively. He is to be TTWB on LLE. Pt is very motivated to work with physical therapy.     PT Comments    Returned for second session to assist pt in toilet transfers and transfers back to bed. He demonstrates improved ability to perform standing transfer from recliner to [facility] and back to bed. Cues still required for hand placement and to maintain TTWB on LLE. Pt is a significant fall risk. He will need SNF placement at discharge. Pt will benefit from PT services to address deficits in strength, balance, and mobility in order to return to full function at home.     Follow Up Recommendations  SNF     Equipment Recommendations  None recommended by PT;Other (comment)(TBD at next venue)    Recommendations for Other Services       Precautions / Restrictions Precautions Precautions: Fall Restrictions Weight Bearing Restrictions: Yes LLE Weight Bearing: Touchdown weight bearing    Mobility  Bed Mobility Overal bed mobility: Needs Assistance Bed Mobility: Sit to Supine       Sit to supine: Min assist   General bed mobility comments: Pt demonstrates fair L hip flexion  strength returning to bed. He still requires assist for LLE  Transfers Overall transfer level: Needs assistance Equipment used: Rolling walker (2 wheeled) Transfers: Sit to/from Stand Sit to Stand: Mod assist;+2 physical assistance         General transfer comment: Performed transfers from recliner as well as BSC. Toilet transfer performed with patient with cues for sequencing and safe hand placement. Pt able to perform short shuffling hops on RLE with mod/maxA+2 support from therapist and tech. Cues to maintain TTWB only on LLE  Ambulation/Gait             General Gait Details: Unable to perform at this time   Stairs            Wheelchair Mobility    Modified Rankin (Stroke Patients Only)       Balance Overall balance assessment: Needs assistance Sitting-balance support: No upper extremity supported Sitting balance-Leahy Scale: Good     Standing balance support: Bilateral upper extremity supported Standing balance-Leahy Scale: Poor Standing balance comment: Requiring minA+1 to stabilize in standing                            Cognition Arousal/Alertness: Awake/alert Behavior During Therapy: WFL for tasks assessed/performed Overall Cognitive Status: Within Functional Limits for tasks assessed                                 General Comments: AOx2, disoriented  to year but oriented to month. This is baseline for patient. He is oriented to situation      Exercises      General Comments        Pertinent Vitals/Pain Pain Assessment: 0-10 Pain Score: 5  Pain Location: L hip with activity Pain Intervention(s): Monitored during session    Home Living                      Prior Function            PT Goals (current goals can now be found in the care plan section) Acute Rehab PT Goals Patient Stated Goal: Go to SNF to get strong enough to return home PT Goal Formulation: With patient/family Time For Goal  Achievement: 12/26/16 Potential to Achieve Goals: Good Progress towards PT goals: Progressing toward goals    Frequency    7X/week      PT Plan Current plan remains appropriate    Co-evaluation              AM-PAC PT "6 Clicks" Daily Activity  Outcome Measure  Difficulty turning over in bed (including adjusting bedclothes, sheets and blankets)?: Unable Difficulty moving from lying on back to sitting on the side of the bed? : Unable Difficulty sitting down on and standing up from a chair with arms (e.g., wheelchair, bedside commode, etc,.)?: Unable Help needed moving to and from a bed to chair (including a wheelchair)?: A Lot Help needed walking in hospital room?: Total Help needed climbing 3-5 steps with a railing? : Total 6 Click Score: 7    End of Session Equipment Utilized During Treatment: Gait belt;Oxygen Activity Tolerance: Patient tolerated treatment well Patient left: with call bell/phone within reach;in bed;with nursing/sitter in room Nurse Communication: Mobility status;Other (comment)(RN present to assist) PT Visit Diagnosis: Unsteadiness on feet (R26.81);Repeated falls (R29.6);Muscle weakness (generalized) (M62.81);Pain Pain - Right/Left: Left Pain - part of body: Hip     Time: 2263-3354 PT Time Calculation (min) (ACUTE ONLY): 14 min  Charges:  $Therapeutic Activity: 8-22 mins                    G Codes:       Lyndel Safe Huprich PT, DPT     Huprich,Jason 12/12/2016, 1:37 PM

## 2016-12-13 ENCOUNTER — Inpatient Hospital Stay: Payer: Medicare PPO

## 2016-12-13 LAB — BASIC METABOLIC PANEL
ANION GAP: 7 (ref 5–15)
BUN: 18 mg/dL (ref 6–20)
CHLORIDE: 100 mmol/L — AB (ref 101–111)
CO2: 23 mmol/L (ref 22–32)
Calcium: 8.8 mg/dL — ABNORMAL LOW (ref 8.9–10.3)
Creatinine, Ser: 0.9 mg/dL (ref 0.61–1.24)
GFR calc Af Amer: >60 mL/min (ref >60)
GLUCOSE: 137 mg/dL — AB (ref 65–99)
POTASSIUM: 4.6 mmol/L (ref 3.5–5.1)
Sodium: 130 mmol/L — ABNORMAL LOW (ref 135–145)

## 2016-12-13 LAB — GLUCOSE, CAPILLARY
Glucose-Capillary: 130 mg/dL — ABNORMAL HIGH (ref 65–99)
Glucose-Capillary: 125 mg/dL — ABNORMAL HIGH (ref 65–99)
Glucose-Capillary: 115 mg/dL — ABNORMAL HIGH (ref 65–99)
Glucose-Capillary: 145 mg/dL — ABNORMAL HIGH (ref 65–99)

## 2016-12-13 MED ORDER — ENOXAPARIN SODIUM 40 MG/0.4ML ~~LOC~~ SOLN: 40.0000 mg | SUBCUTANEOUS | 0 refills | Status: DC

## 2016-12-13 MED ORDER — SENNA 8.6 MG PO TABS: 1.0000 | ORAL_TABLET | Freq: Every day | ORAL | 0 refills | Status: DC

## 2016-12-13 MED ORDER — DOCUSATE SODIUM 100 MG PO CAPS: 100.0000 mg | ORAL_CAPSULE | Freq: Two times a day (BID) | ORAL | 0 refills | Status: DC

## 2016-12-13 MED ORDER — FLEET ENEMA 7-19 GM/118ML RE ENEM
1.0000 | ENEMA | Freq: Once | RECTAL | Status: AC
  Administered 2016-12-13: 1 via RECTAL

## 2016-12-13 MED ORDER — OXYCODONE HCL 5 MG PO TABS: 5.0000 mg | ORAL_TABLET | ORAL | 0 refills | Status: DC | PRN

## 2016-12-13 MED ORDER — SENNA 8.6 MG PO TABS
1.0000 | ORAL_TABLET | Freq: Every day | ORAL | Status: DC
  Administered 2016-12-13: 8.6 mg via ORAL
  Filled 2016-12-13: qty 1

## 2016-12-13 NOTE — Progress Notes (Signed)
Pt given enema with very positive results. Pt ready for discharge to [facility]. Report called to facility. Pt will travel via EMS. Family at bedside.

## 2016-12-13 NOTE — Clinical Social Work Placement (Signed)
   CLINICAL SOCIAL WORK PLACEMENT  NOTE  Date:  12/13/2016  Patient Details  Name: Ruben Flores MRN: [id number] Date of Birth: 1935-04-06  Clinical Social Work is seeking post-discharge placement for this patient at the [place] level of care (*CSW will initial, date and re-position this form in  chart as items are completed):  Yes   Patient/family provided with [place] Work Department's list of facilities offering this level of care within the geographic area requested by the patient (or if unable, by the patient's family).  Yes   Patient/family informed of their freedom to choose among providers that offer the needed level of care, that participate in Medicare, Medicaid or managed care program needed by the patient, have an available bed and are willing to accept the patient.  Yes   Patient/family informed of [place]'s ownership interest in [facility] and [facility], as well as of the fact that they are under no obligation to receive care at these facilities.  PASRR submitted to EDS on 12/11/16     PASRR number received on 12/11/16     Existing PASRR number confirmed on       FL2 transmitted to all facilities in geographic area requested by pt/family on 12/11/16     FL2 transmitted to all facilities within larger geographic area on       Patient informed that his/her managed care company has contracts with or will negotiate with certain facilities, including the following:        Yes   Patient/family informed of bed offers received.  Patient chooses bed at [facility] )     Physician recommends and patient chooses bed at      Patient to be transferred to [facility] ) on 12/13/16.  Patient to be transferred to facility by [facility] )     Patient family notified on 12/13/16 of transfer.  Name of family member notified:  (Patient's son Orest is at bedside and aware of D/C today. )      PHYSICIAN       Additional Comment:    _______________________________________________ Filimon Miranda, Veronia Beets, LCSW 12/13/2016, 10:40 AM

## 2016-12-13 NOTE — Care Management Important Message (Signed)
Important Message  Patient Details  Name: Ruben Flores MRN: [id number] Date of Birth: Aug 22, 1935   Medicare Important Message Given:  Yes    Jolly Mango, RN 12/13/2016, 9:47 AM

## 2016-12-13 NOTE — Discharge Summary (Signed)
Ruben Flores, is a 81 y.o. male  DOB 03-Jan-1935  MRN [id number].  Admission date:  12/10/2016  Admitting Physician  Fritzi Mandes, MD  Discharge Date:  12/13/2016   Primary MD  Derinda Late, MD  Recommendations for primary care physician for things to follow:   Follow-up with PCP in 1 week Follow-up with Dr. Thornton Park 2 weeks.   Admission Diagnosis  Hyponatremia [E87.1] Weakness [R53.1] Closed nondisplaced fracture of anterior column of left acetabulum, initial encounter [facility]) [S32.435A]   Discharge Diagnosis  Hyponatremia [E87.1] Weakness [R53.1] Closed nondisplaced fracture of anterior column of left acetabulum, initial encounter ([place]) [S32.435A]    Active Problems:   Hip fracture [facility])      Past Medical History:  Diagnosis Date  . Cancer ([place]) 2016   skin  . CHF (congestive heart failure) ([place])   . COPD (chronic obstructive pulmonary disease) ([place])   . Diabetes mellitus without complication ([place])     History reviewed. No pertinent surgical history.     History of present illness and  Hospital Course:     Kindly see H&P for history of present illness and admission details, please review complete Labs, Consult reports and Test reports for all details in brief  HPI  from the history and physical done on the day of admission 81 year old male patient with history of cardiomyopathy with EF 20-25%, diabetes mellitus, COPD on home oxygen comes because of mechanical fall at home.  Patient has history of falls lately, patient noted to have bruise on elbow and knee.  X-ray of the hip showed a comminuted left acetabular fracture and also left superior ramus fracture.   Hospital Course  #1 acute left minimally displaced acetabular fracture: Seen by orthopedic Dr. Thornton Park, patient has left hip  pain, because there is a minimally displaced acetabular fracture, conservative management recommended by orthopedic.  The patient will be touchdown weightbearing on the left lower extremity for up to 12 weeks and he can use walker for her distances and wheelchair for long distances.  Patient has been falling at home lately, required assistance at discharge so he is going to [facility] rehab today, continue Lovenox for 2 weeks, incentive spirometry, Norco for pain control up to 3 tablets a day for 1 week.  And use Tylenol for pain control after that. 2.  Cardiomyopathy with EF 20-20%.  Patient is on Lasix, Coreg, Aldactone, lisinopril. 3.  Diabetes mellitus type 2: Patient is on metformin.  Initially received sliding scale insulin. 4.  Hyponatremia secondary to CHF.corrected. CODE STATUS full code.     Discharge Condition: stable   Follow UP   Contact information for follow-up providers    Thornton Park, MD. Schedule an appointment as soon as possible for a visit in 2 week(s).   Specialty:  Orthopedic Surgery Contact information: [place] [address] [phone]            Contact information for after-discharge care    Destination    [facility] Follow up.   Service:  Skilled Nursing Contact information: [place] [phone]                    Discharge Instructions  and  Discharge Medications   Continue Lovenox for 2 weeks for DVT prophylaxis   Allergies as of 12/13/2016   No Known Allergies     Medication List    TAKE these medications   albuterol 108 (  90 Base) MCG/ACT inhaler Commonly known as:  PROVENTIL HFA;VENTOLIN HFA Inhale 2 puffs into the lungs every 6 (six) hours as needed for wheezing or shortness of breath.   clonazePAM 0.5 MG tablet Commonly known as:  KLONOPIN Take 0.5 mg by mouth 2 (two) times daily as needed for anxiety.   docusate sodium 100 MG  capsule Commonly known as:  COLACE Take 1 capsule (100 mg total) by mouth 2 (two) times daily.   enoxaparin 40 MG/0.4ML injection Commonly known as:  LOVENOX Inject 0.4 mLs (40 mg total) into the skin daily.   fluticasone 50 MCG/ACT nasal spray Commonly known as:  FLONASE Place 2 sprays into both nostrils daily.   fluticasone-salmeterol 230-21 MCG/ACT inhaler Commonly known as:  ADVAIR HFA Inhale 2 puffs into the lungs 2 (two) times daily.   furosemide 40 MG tablet Commonly known as:  LASIX Take 40 mg by mouth daily.   latanoprost 0.005 % ophthalmic solution Commonly known as:  XALATAN Place 1 drop into both eyes at bedtime.   lisinopril 5 MG tablet Commonly known as:  PRINIVIL,ZESTRIL Take 5 mg by mouth daily.   metFORMIN 500 MG tablet Commonly known as:  GLUCOPHAGE Take 1,000 mg by mouth daily.   metoprolol succinate 50 MG 24 hr tablet Commonly known as:  TOPROL-XL Take 50 mg by mouth daily. Take with or immediately following a meal.   omeprazole 20 MG capsule Commonly known as:  PRILOSEC Take 20 mg by mouth daily.   oxyCODONE 5 MG immediate release tablet Commonly known as:  Oxy IR/ROXICODONE Take 1 tablet (5 mg total) by mouth every 4 (four) hours as needed for moderate pain.   potassium chloride 10 MEQ tablet Commonly known as:  K-DUR,KLOR-CON Take 10 mEq by mouth 2 (two) times daily.   senna 8.6 MG Tabs tablet Commonly known as:  SENOKOT Take 1 tablet (8.6 mg total) by mouth daily.   sertraline 50 MG tablet Commonly known as:  ZOLOFT Take 50 mg by mouth daily.   simvastatin 40 MG tablet Commonly known as:  ZOCOR Take 40 mg by mouth daily.   spironolactone 25 MG tablet Commonly known as:  ALDACTONE Take 25 mg by mouth daily.         Diet and Activity recommendation: See Discharge Instructions above   Consults obtained -orthopedic   Major procedures and Radiology Reports - PLEASE review detailed and final reports for all details, in brief  -      Ct Pelvis Wo Contrast  Result Date: 12/10/2016 CLINICAL DATA:  Patient slipped and fell last evening complaining of left hip pain with movement. EXAM: CT PELVIS WITHOUT CONTRAST TECHNIQUE: Multidetector CT imaging of the pelvis was performed following the standard protocol without intravenous contrast. COMPARISON:  12/10/2016 pelvis and left hip radiographs. FINDINGS: Urinary Tract:  Intact urinary bladder.  No distal uropathy. Bowel:  Unremarkable visualized pelvic bowel loops. Vascular/Lymphatic: No pathologically enlarged lymph nodes. No significant vascular abnormality seen. Reproductive:  Prostate and seminal vesicles are unremarkable. Other:  No free air free fluid. Musculoskeletal: An acute, closed, coronal fracture of the acetabulum, medial wall of the acetabular and high left superior pubic ramus are noted in keeping with an anterior wall fracture of the left acetabulum. No extension into the iliac bone nor involvement of the obturator ring. No fracture of the proximal femora. IMPRESSION: Acute, comminuted, left anterior wall fracture of the left acetabulum also involving the high left superior pubic ramus. No significant displacement, joint effusion or proximal  femoral fracture. Electronically Signed   By: Ashley Royalty M.D.   On: 12/10/2016 17:12   Dg Chest Portable 1 View  Result Date: 12/10/2016 CLINICAL DATA:  Left shoulder and left hip pain after fall. EXAM: PORTABLE CHEST 1 VIEW COMPARISON:  01/20/2016 FINDINGS: Left chest wall pacer device is noted with lead in the right atrial appendage and right ventricle. Normal heart size. Aortic atherosclerosis. No pleural effusion or edema. No airspace opacities. IMPRESSION: 1. No acute cardiopulmonary abnormalities. 2.  Aortic Atherosclerosis (ICD10-I70.0). Electronically Signed   By: Kerby Moors M.D.   On: 12/10/2016 15:48   Dg Shoulder Left  Result Date: 12/10/2016 CLINICAL DATA:  Recent fall with left shoulder pain, initial  encounter EXAM: LEFT SHOULDER - 2+ VIEW COMPARISON:  None. FINDINGS: Degenerative changes of the acromioclavicular joint are noted. A pacing device is seen. No acute fracture or dislocation is noted. No soft tissue abnormality is seen. IMPRESSION: Degenerative change without acute abnormality. Electronically Signed   By: Inez Catalina M.D.   On: 12/10/2016 15:44   Dg Hip Unilat W Or Wo Pelvis 2-3 Views Left  Result Date: 12/10/2016 CLINICAL DATA:  Left hip pain after fall. EXAM: DG HIP (WITH OR WITHOUT PELVIS) 2-3V LEFT COMPARISON:  None. FINDINGS: There is no evidence of hip fracture or dislocation. There is no evidence of arthropathy or other focal bone abnormality. IMPRESSION: Negative. Electronically Signed   By: Misty Stanley M.D.   On: 12/10/2016 15:41    Micro Results    Recent Results (from the past 240 hour(s))  Surgical pcr screen     Status: None   Collection Time: 12/10/16 10:35 PM  Result Value Ref Range Status   MRSA, PCR NEGATIVE NEGATIVE Final   Staphylococcus aureus NEGATIVE NEGATIVE Final    Comment: (NOTE) The Xpert SA Assay (FDA approved for NASAL specimens in patients 79 years of age and older), is one component of a comprehensive surveillance program. It is not intended to diagnose infection nor to guide or monitor treatment.        Today   Subjective:   Ruben Flores today is stable for discharge.  Objective:   Blood pressure 121/64, pulse 100, temperature 98.4 F (36.9 C), temperature source Oral, resp. rate 15, height 5\' 9"  (1.753 m), weight 64.9 kg (143 lb), SpO2 97 %.   Intake/Output Summary (Last 24 hours) at 12/13/2016 0924 Last data filed at 12/13/2016 0000 Gross per 24 hour  Intake 480 ml  Output 500 ml  Net -20 ml    Exam Awake Alert, Oriented x 3, No new F.N deficits, Normal affect [place].AT,PERRAL Supple Neck,No JVD, No cervical lymphadenopathy appriciated.  Symmetrical Chest wall movement, Good air movement bilaterally, CTAB RRR,No  Gallops,Rubs or new Murmurs, No Parasternal Heave +ve B.Sounds, Abd Soft, Non tender, No organomegaly appriciated, No rebound -guarding or rigidity. No Cyanosis, Clubbing or edema, No new Rash or bruise  Data Review   CBC w Diff:  Lab Results  Component Value Date   WBC 6.9 12/12/2016   HGB 9.3 (L) 12/12/2016   HCT 27.3 (L) 12/12/2016   PLT 101 (L) 12/12/2016   LYMPHOPCT 4 12/10/2016   MONOPCT 4 12/10/2016   EOSPCT 2 12/10/2016   BASOPCT 0 12/10/2016    CMP:  Lab Results  Component Value Date   NA 130 (L) 12/13/2016   K 4.6 12/13/2016   CL 100 (L) 12/13/2016   CO2 23 12/13/2016   BUN 18 12/13/2016   CREATININE 0.90 12/13/2016  PROT 6.5 12/10/2016   ALBUMIN 3.7 12/10/2016   BILITOT 0.4 12/10/2016   ALKPHOS 50 12/10/2016   AST 21 12/10/2016   ALT 16 (L) 12/10/2016  .   Total Time in preparing paper work, data evaluation and todays exam - 35 minutes  Epifanio Lesches M.D on 12/13/2016 at 9:24 AM    Note: This dictation was prepared with Dragon dictation along with smaller phrase technology. Any transcriptional errors that result from this process are unintentional.

## 2016-12-13 NOTE — Progress Notes (Signed)
Physical Therapy Treatment Patient Details Name: Ruben Flores MRN: [id number] DOB: Mar 18, 1935 Today's Date: 12/13/2016    History of Present Illness 81 y.o.malewith a known history of cardiomyopathy EF of 20-25%, diabetes, COPD on home oxygen comes to the emergency room by EMS accompanied by wife after he had a mechanical fall at home. According to the wife patient has been falling lately at home. He has bruised skin over the elbow and knees. He had a fall yesterday started having left hip pain came to the emergency room was found to have acute, comminuted, left anterior wall fracture of the left acetabulum also involving the high left superior pubic ramus. Patient denies any chest pain shortness of breath. He wears chronic oxygen. Pt seen by orthopedics and is going to be managed non-operatively. He is to be TTWB on LLE. Pt is very motivated to work with physical therapy.     PT Comments    Pt showed very good effort with supine bed exercises but is pain limited; most notedly with L hip Abd but even with bracing while doing exercises on the R.  Pt is weak and needs regular rest breaks (and cuing for breathing) even with only minimally resisted/AROM activities.   Pt acknowledges that he is weak and will have a lot of work ahead of him in rehab but is eager to work hard and do all he can to get back to safe and functional mobility.  Follow Up Recommendations  SNF     Equipment Recommendations       Recommendations for Other Services       Precautions / Restrictions Precautions Precautions: Fall Restrictions LLE Weight Bearing: Touchdown weight bearing    Mobility  Bed Mobility               General bed mobility comments: deferred bed mobility as pt is waiting on having a BM as well as slated to d/c today  Transfers                    Ambulation/Gait                 Stairs            Wheelchair Mobility    Modified Rankin (Stroke Patients  Only)       Balance                                            Cognition Arousal/Alertness: Awake/alert Behavior During Therapy: WFL for tasks assessed/performed Overall Cognitive Status: Within Functional Limits for tasks assessed                                        Exercises General Exercises - Lower Extremity Ankle Circles/Pumps: Both;10 reps;Strengthening(resisted DF) Quad Sets: Strengthening;Both;10 reps Gluteal Sets: Strengthening;Both;10 reps Short Arc Quad: Strengthening;Left;10 reps Heel Slides: AROM;Strengthening;10 reps;Both Hip ABduction/ADduction: Strengthening;AAROM;10 reps;Both Straight Leg Raises: AAROM;10 reps;Both  Elbow Flexion/Extension: Strengthening;10 reps;Both    General Comments        Pertinent Vitals/Pain Pain Score: 4  Pain Location: L hip with activity    Home Living                      Prior Function  PT Goals (current goals can now be found in the care plan section) Progress towards PT goals: Progressing toward goals    Frequency    7X/week      PT Plan Current plan remains appropriate    Co-evaluation              AM-PAC PT "6 Clicks" Daily Activity  Outcome Measure  Difficulty turning over in bed (including adjusting bedclothes, sheets and blankets)?: Unable Difficulty moving from lying on back to sitting on the side of the bed? : Unable Difficulty sitting down on and standing up from a chair with arms (e.g., wheelchair, bedside commode, etc,.)?: Unable Help needed moving to and from a bed to chair (including a wheelchair)?: A Lot Help needed walking in hospital room?: Total Help needed climbing 3-5 steps with a railing? : Total 6 Click Score: 7    End of Session Equipment Utilized During Treatment: Gait belt;Oxygen Activity Tolerance: Patient tolerated treatment well;Patient limited by pain Patient left: with bed alarm set;with call bell/phone within  reach;with family/visitor present   PT Visit Diagnosis: Unsteadiness on feet (R26.81);Repeated falls (R29.6);Muscle weakness (generalized) (M62.81);Pain Pain - Right/Left: Left Pain - part of body: Hip     Time: 2010-0712 PT Time Calculation (min) (ACUTE ONLY): 31 min  Charges:  $Therapeutic Exercise: 23-37 mins                    G Codes:       Kreg Shropshire, DPT 12/13/2016, 12:48 PM

## 2016-12-13 NOTE — Progress Notes (Signed)
  Subjective:  Patient is doing fine.   He is lying in bed.  His wife is at the bedside. Patient reports pain as mild to moderate.  He states he feels worn out this morning but has been able to participate with physical therapy. He has not yet had a bowel movement.  Objective:   VITALS:   Vitals:   12/12/16 2019 12/13/16 0407 12/13/16 0611 12/13/16 0904  BP: 110/63 (!) 113/57  121/64  Pulse: (!) 102 (!) 107  100  Resp: 18 15    Temp: 98.6 F (37 C) 99.3 F (37.4 C)  98.4 F (36.9 C)  TempSrc: Oral Oral  Oral  SpO2: 96% 96%  97%  Weight:   64.9 kg (143 lb)   Height:        PHYSICAL EXAM: Left lower extremity: Patient's skin is intact. He has ecchymosis over the left lateral hip. His compartments are soft and compressible. Distally he is neurovascularly intact and has intact motor function. There is no leg length discrepancy or any significant rotational deformity seen.   LABS  Results for orders placed or performed during the hospital encounter of 12/10/16 (from the past 24 hour(s))  Glucose, capillary     Status: Abnormal   Collection Time: 12/12/16 11:54 AM  Result Value Ref Range   Glucose-Capillary 159 (H) 65 - 99 mg/dL  Glucose, capillary     Status: Abnormal   Collection Time: 12/12/16  5:36 PM  Result Value Ref Range   Glucose-Capillary 137 (H) 65 - 99 mg/dL  Glucose, capillary     Status: Abnormal   Collection Time: 12/12/16 11:32 PM  Result Value Ref Range   Glucose-Capillary 150 (H) 65 - 99 mg/dL   Comment 1 Notify RN   Basic metabolic panel     Status: Abnormal   Collection Time: 12/13/16  4:25 AM  Result Value Ref Range   Sodium 130 (L) 135 - 145 mmol/L   Potassium 4.6 3.5 - 5.1 mmol/L   Chloride 100 (L) 101 - 111 mmol/L   CO2 23 22 - 32 mmol/L   Glucose, Bld 137 (H) 65 - 99 mg/dL   BUN 18 6 - 20 mg/dL   Creatinine, Ser 0.90 0.61 - 1.24 mg/dL   Calcium 8.8 (L) 8.9 - 10.3 mg/dL   GFR calc non Af Amer >60 >60 mL/min   GFR calc Af Amer >60 >60 mL/min   Anion gap 7 5 - 15  Glucose, capillary     Status: Abnormal   Collection Time: 12/13/16  8:02 AM  Result Value Ref Range   Glucose-Capillary 130 (H) 65 - 99 mg/dL    No results found.  Assessment/Plan:     Active Problems:   Hip fracture [facility])  Patient is being discharged to [facility] later today after he has a bowel movement. Patient will remain toe-touch weightbearing only on the left lower extremity. He'll require a walker for assistance with ambulation short distances. He will likely require wheelchair for long distances. Patient will follow up with me in 1-2 weeks in my office for reevaluation x-ray. I recommend patient take Lovenox 40 mg daily for 2 weeks.    Thornton Park , MD 12/13/2016, 11:52 AM

## 2016-12-13 NOTE — Progress Notes (Signed)
Patient is medically stable for D/C to [facility] today. [facility] authorization has been received through [place] health. Per [facility] admissions coordinator at [facility] patient can come today to room 210-A. RN will call report to [phone] and arrange EMS for transport. Clinical Education officer, museum (CSW) sent D/C orders to [employer] via [employer]. Patient is aware of above. Patient's son Traves is at bedside and aware of above. Please reconsult if future social work needs arise. CSW signing off.   [employer], LCSW [phone]

## 2016-12-13 NOTE — Progress Notes (Addendum)
Spoke with the patient's son by phone today. He had mentioned the patient was complaining of neck pain. When I went back to interview the patient he states he is having neck pain and has had headaches since the fall. His wife, who is at the bedside reveals that he hit his head during the fall. Therefore in order a CT scan of his head and neck to rule out any injury in these locations.  On exam patient is not having any neurologic deficits in the bilateral upper extremities. He has mild tenderness to his cervical spine. There is no evidence of bruising or trauma to his head.

## 2016-12-17 ENCOUNTER — Other Ambulatory Visit: Payer: Self-pay

## 2016-12-17 MED ORDER — CLONAZEPAM 0.5 MG PO TABS: 0.5000 mg | ORAL_TABLET | Freq: Two times a day (BID) | ORAL | 0 refills | Status: DC | PRN

## 2016-12-17 MED ORDER — OXYCODONE HCL 5 MG PO TABS: 5.0000 mg | ORAL_TABLET | ORAL | 0 refills | Status: DC | PRN

## 2016-12-17 MED ORDER — TRAMADOL HCL 50 MG PO TABS: 50.0000 mg | ORAL_TABLET | ORAL | 0 refills | Status: DC | PRN

## 2016-12-17 NOTE — Telephone Encounter (Signed)
Rx sent to [facility] phone : [phone] , fax : [phone]  

## 2016-12-18 ENCOUNTER — Other Ambulatory Visit
Admission: RE | Admit: 2016-12-18 | Discharge: 2016-12-18 | Disposition: A | Discharge status: Home / Self Care | Payer: Medicare PPO | Source: Ambulatory Visit | Attending: Gerontology | Admitting: Gerontology

## 2016-12-18 DIAGNOSIS — D649 Anemia, unspecified: Secondary | ICD-10-CM | POA: Insufficient documentation

## 2016-12-18 DIAGNOSIS — E871 Hypo-osmolality and hyponatremia: Secondary | ICD-10-CM | POA: Insufficient documentation

## 2016-12-18 LAB — CBC WITH DIFFERENTIAL/PLATELET
BASOS ABS: 0 10*3/uL (ref 0–0.1)
BASOS PCT: 1 %
Eosinophils Absolute: 0.1 10*3/uL (ref 0–0.7)
Eosinophils Relative: 2 %
HEMATOCRIT: 26.3 % — AB (ref 40.0–52.0)
HEMOGLOBIN: 8.6 g/dL — AB (ref 13.0–18.0)
LYMPHS PCT: 9 %
Lymphs Abs: 0.5 10*3/uL — ABNORMAL LOW (ref 1.0–3.6)
MCH: 29 pg (ref 26.0–34.0)
MCHC: 32.8 g/dL (ref 32.0–36.0)
MCV: 88.4 fL (ref 80.0–100.0)
Monocytes Absolute: 0.6 10*3/uL (ref 0.2–1.0)
Monocytes Relative: 10 %
NEUTROS ABS: 4.7 10*3/uL (ref 1.4–6.5)
NEUTROS PCT: 80 %
Platelets: 149 10*3/uL — ABNORMAL LOW (ref 150–440)
RBC: 2.98 MIL/uL — AB (ref 4.40–5.90)
RDW: 14.7 % — AB (ref 11.5–14.5)
WBC: 5.9 10*3/uL (ref 3.8–10.6)

## 2016-12-18 LAB — COMPREHENSIVE METABOLIC PANEL
ALBUMIN: 3.3 g/dL — AB (ref 3.5–5.0)
ALT: 16 U/L — AB (ref 17–63)
AST: 21 U/L (ref 15–41)
Alkaline Phosphatase: 59 U/L (ref 38–126)
Anion gap: 10 (ref 5–15)
BILIRUBIN TOTAL: 0.5 mg/dL (ref 0.3–1.2)
BUN: 25 mg/dL — AB (ref 6–20)
CO2: 22 mmol/L (ref 22–32)
CREATININE: 0.86 mg/dL (ref 0.61–1.24)
Calcium: 9.2 mg/dL (ref 8.9–10.3)
Chloride: 95 mmol/L — ABNORMAL LOW (ref 101–111)
GFR calc Af Amer: >60 mL/min (ref >60)
GLUCOSE: 112 mg/dL — AB (ref 65–99)
POTASSIUM: 4.7 mmol/L (ref 3.5–5.1)
Sodium: 127 mmol/L — ABNORMAL LOW (ref 135–145)
TOTAL PROTEIN: 6.4 g/dL — AB (ref 6.5–8.1)

## 2016-12-18 LAB — URINALYSIS, COMPLETE (UACMP) WITH MICROSCOPIC
Bilirubin Urine: NEGATIVE
Glucose, UA: NEGATIVE mg/dL
KETONES UR: NEGATIVE mg/dL
Nitrite: NEGATIVE
PH: 6 (ref 5.0–8.0)
PROTEIN: 30 mg/dL — AB
Specific Gravity, Urine: 1.011 (ref 1.005–1.030)

## 2016-12-19 ENCOUNTER — Other Ambulatory Visit
Admission: RE | Admit: 2016-12-19 | Discharge: 2016-12-19 | Disposition: A | Discharge status: Home / Self Care | Payer: Medicare PPO | Source: Ambulatory Visit | Attending: Internal Medicine | Admitting: Internal Medicine

## 2016-12-19 DIAGNOSIS — D649 Anemia, unspecified: Secondary | ICD-10-CM | POA: Insufficient documentation

## 2016-12-19 DIAGNOSIS — R300 Dysuria: Secondary | ICD-10-CM | POA: Insufficient documentation

## 2016-12-19 DIAGNOSIS — E871 Hypo-osmolality and hyponatremia: Secondary | ICD-10-CM | POA: Insufficient documentation

## 2016-12-19 LAB — URINALYSIS, COMPLETE (UACMP) WITH MICROSCOPIC
BACTERIA UA: NONE SEEN
Bilirubin Urine: NEGATIVE
GLUCOSE, UA: NEGATIVE mg/dL
HGB URINE DIPSTICK: NEGATIVE
KETONES UR: NEGATIVE mg/dL
LEUKOCYTES UA: NEGATIVE
NITRITE: NEGATIVE
PROTEIN: NEGATIVE mg/dL
Specific Gravity, Urine: 1.005 (ref 1.005–1.030)
pH: 5 (ref 5.0–8.0)

## 2016-12-20 ENCOUNTER — Other Ambulatory Visit
Admission: RE | Admit: 2016-12-20 | Discharge: 2016-12-20 | Disposition: A | Discharge status: Home / Self Care | Payer: Medicare PPO | Source: Ambulatory Visit | Attending: Gerontology | Admitting: Gerontology

## 2016-12-20 DIAGNOSIS — D649 Anemia, unspecified: Secondary | ICD-10-CM | POA: Diagnosis present

## 2016-12-20 LAB — CBC WITH DIFFERENTIAL/PLATELET
Basophils Absolute: 0 10*3/uL (ref 0–0.1)
Basophils Relative: 1 %
EOS ABS: 0.1 10*3/uL (ref 0–0.7)
EOS PCT: 1 %
HCT: 27.3 % — ABNORMAL LOW (ref 40.0–52.0)
Hemoglobin: 9.1 g/dL — ABNORMAL LOW (ref 13.0–18.0)
LYMPHS ABS: 0.6 10*3/uL — AB (ref 1.0–3.6)
LYMPHS PCT: 8 %
MCH: 30 pg (ref 26.0–34.0)
MCHC: 33.2 g/dL (ref 32.0–36.0)
MCV: 90.4 fL (ref 80.0–100.0)
MONO ABS: 0.6 10*3/uL (ref 0.2–1.0)
MONOS PCT: 9 %
Neutro Abs: 5.7 10*3/uL (ref 1.4–6.5)
Neutrophils Relative %: 81 %
PLATELETS: 173 10*3/uL (ref 150–440)
RBC: 3.02 MIL/uL — AB (ref 4.40–5.90)
RDW: 14.7 % — AB (ref 11.5–14.5)
WBC: 7 10*3/uL (ref 3.8–10.6)

## 2016-12-20 LAB — URINE CULTURE

## 2016-12-20 LAB — COMPREHENSIVE METABOLIC PANEL
ALT: 23 U/L (ref 17–63)
ANION GAP: 6 (ref 5–15)
AST: 22 U/L (ref 15–41)
Albumin: 3.2 g/dL — ABNORMAL LOW (ref 3.5–5.0)
Alkaline Phosphatase: 70 U/L (ref 38–126)
BUN: 13 mg/dL (ref 6–20)
CHLORIDE: 105 mmol/L (ref 101–111)
CO2: 22 mmol/L (ref 22–32)
CREATININE: 0.72 mg/dL (ref 0.61–1.24)
Calcium: 9.3 mg/dL (ref 8.9–10.3)
Glucose, Bld: 110 mg/dL — ABNORMAL HIGH (ref 65–99)
Potassium: 4.3 mmol/L (ref 3.5–5.1)
SODIUM: 133 mmol/L — AB (ref 135–145)
Total Bilirubin: 0.5 mg/dL (ref 0.3–1.2)
Total Protein: 6.6 g/dL (ref 6.5–8.1)

## 2016-12-21 ENCOUNTER — Non-Acute Institutional Stay (SKILLED_NURSING_FACILITY): Payer: Medicare PPO | Admitting: Gerontology

## 2016-12-21 DIAGNOSIS — S32453A Displaced transverse fracture of unspecified acetabulum, initial encounter for closed fracture: Secondary | ICD-10-CM

## 2016-12-21 DIAGNOSIS — R531 Weakness: Secondary | ICD-10-CM

## 2016-12-21 LAB — URINE CULTURE: CULTURE: NO GROWTH

## 2017-01-01 ENCOUNTER — Encounter
Admission: RE | Admit: 2017-01-01 | Discharge: 2017-01-01 | Disposition: A | Discharge status: Home / Self Care | Payer: Medicare PPO | Source: Ambulatory Visit | Attending: Internal Medicine | Admitting: Internal Medicine

## 2017-01-02 ENCOUNTER — Encounter: Payer: Self-pay | Admitting: Gerontology

## 2017-01-02 ENCOUNTER — Non-Acute Institutional Stay (SKILLED_NURSING_FACILITY): Payer: Medicare Other | Admitting: Gerontology

## 2017-01-02 DIAGNOSIS — R531 Weakness: Secondary | ICD-10-CM | POA: Diagnosis not present

## 2017-01-02 DIAGNOSIS — S32453A Displaced transverse fracture of unspecified acetabulum, initial encounter for closed fracture: Secondary | ICD-10-CM

## 2017-01-02 NOTE — Progress Notes (Addendum)
Location:   [facility] Room Number: [place] of Service:  [id number])  Provider: Toni Arthurs, NP-C  PCP: Derinda Late, MD Patient Care Team: Derinda Late, MD as PCP - General (Family Medicine)  Extended Emergency Contact Information Primary Emergency Contact: Worthy Keeler Address: [address]          [place], [place] [address] Home Phone: [phone] Relation: None Secondary Emergency Contact: Mcaleer,dwight Home Phone: [phone] Relation: Son  Code Status: FULL Goals of care:  Advanced Directive information Advanced Directives 01/02/2017  Does Patient Have a Medical Advance Directive? No  Type of Advance Directive -  Does patient want to make changes to medical advance directive? No - Patient declined  Copy of [place] in Chart? -     Allergies  Allergen Reactions  . Citalopram Diarrhea  . Fluvastatin     Other reaction(s): Other (See Comments) REFLUX  . Paroxetine Hcl     Other reaction(s): Unknown    Chief Complaint  Patient presents with  . Discharge Note    Discharged from SNF    HPI:  82 y.o. male seen today for discharge evaluation. Pt was admitted to the facility for rehab following hospitalization for fall with Left Acetabulum fracture and weakness. Pt has been participating in PT and OT. Pt is touchdown weight bearing only. Weakness has improved. Mobility has increased. Pt reports his appetite has improved. He is voiding without difficulty and having regular BMs. Pt reports his pain is well controlled and is ready for discharge. VSS. No other complaints.     Past Medical History:  Diagnosis Date  . Acute recurrent sinusitis, unspecified   . Anemia, unspecified   . B12 deficiency    diagnosed in 02/12/2008  . CAD (coronary artery disease)    atherosclerotic coronary artery disease  . Cancer ([place]) 2016   skin  . Cardiac resynchronization therapy pacemaker (CRT-P) in place 08/17/2016  .  Cardiomyopathy, secondary ([place])   . CHF (congestive heart failure) ([place])   . COPD (chronic obstructive pulmonary disease) ([place])   . Depression    unspecifed  . Diabetes mellitus without complication ([place])   . Duodenitis   . History of elevated glucose   . History of esophageal stricture   . History of gastritis   . History of gastroesophageal reflux (GERD)   . History of palpitations    chronic  . Hyperlipidemia, unspecified   . IBS (irritable bowel syndrome)   . Lipoma of back   . Memory loss    ? due to B12 deficiency. Eval Dr. Bridgett Larsson  . NAION (non-arteritic anterior ischemic optic neuropathy)    NAION left eye dx'd 4/14 by Dr. Talbert Forest felt secondary to orthostatic hypotension with hypoperfusion.  Marland Kitchen Perennial allergic rhinitis       . Type 2 diabetes, diet controlled ([place])   . VBI (vertebrobasilar insufficiency)    VBI with acute vertigo 9/10. MRI brain unremarkable    Past Surgical History:  Procedure Laterality Date  . CARDIAC CATHETERIZATION  05/1990   12/1995  . CATARACT EXTRACTION, BILATERAL  2014  . COLONOSCOPY    . ESOPHAGEAL DILATION  06/1990   Dr. Vira Agar by EGD for esophageal stricture  . KNEE ARTHROSCOPY Left 05/06/2009   Left knee arthroscopy, partial medial and lateral meniscectomies, chondroplasty of the lateral femoral condyle and patellofemoral articulation  . LIPOMA EXCISION  07/27/2008   from back  . MOHS SURGERY     skin  cancer excision      reports that he quit smoking about 24 years ago. His smoking use included cigarettes. He has a 30.00 pack-year smoking history. His smokeless tobacco use includes chew. He reports that he does not drink alcohol. His drug history is not on file. Social History   Socioeconomic History  . Marital status: Married    Spouse name: Not on file  . Number of children: 2  . Years of education: 44  . Highest education level: High school graduate  Social Needs  . Financial resource strain: Not on file  . Food insecurity  - worry: Not on file  . Food insecurity - inability: Not on file  . Transportation needs - medical: Not on file  . Transportation needs - non-medical: Not on file  Occupational History  . Not on file  Tobacco Use  . Smoking status: Former Smoker    Packs/day: 1.00    Years: 30.00    Pack years: 30.00    Types: Cigarettes    Last attempt to quit: 01/01/1993    Years since quitting: 24.0  . Smokeless tobacco: Current User    Types: Chew  . Tobacco comment: quit 20 years ago  Substance and Sexual Activity  . Alcohol use: No    Frequency: Never  . Drug use: Not on file  . Sexual activity: Not on file  Other Topics Concern  . Not on file  Social History Narrative   Full code   Former smoker   Alcohol use : none   Married   2 children   Functional Status Survey:    Allergies  Allergen Reactions  . Citalopram Diarrhea  . Fluvastatin     Other reaction(s): Other (See Comments) REFLUX  . Paroxetine Hcl     Other reaction(s): Unknown    Pertinent  Health Maintenance Due  Topic Date Due  . PNA vac Low Risk Adult (1 of 2 - PCV13) 12/09/2002  . INFLUENZA VACCINE  08/01/2016    Medications: Allergies as of 01/02/2017      Reactions   Citalopram Diarrhea   Fluvastatin    Other reaction(s): Other (See Comments) REFLUX   Paroxetine Hcl    Other reaction(s): Unknown      Medication List        Accurate as of 01/02/17  4:37 PM. Always use your most recent med list.          acetaminophen 325 MG tablet Commonly known as:  TYLENOL Take 650 mg by mouth 4 (four) times daily.   acetaminophen 325 MG tablet Commonly known as:  TYLENOL Take 650 mg by mouth every 4 (four) hours as needed.   albuterol 108 (90 Base) MCG/ACT inhaler Commonly known as:  PROVENTIL HFA;VENTOLIN HFA Inhale 2 puffs into the lungs every 6 (six) hours as needed for wheezing or shortness of breath.   ASPERCREME LIDOCAINE 4 % Ptch Generic drug:  Lidocaine Apply 1 patch topically daily. Apply to  left hip area  Remove after 12 hours   Cholecalciferol 4000 units Caps Take 1 capsule by mouth daily.   clonazePAM 0.5 MG tablet Commonly known as:  KLONOPIN Take 0.5 mg by mouth 2 (two) times daily as needed for anxiety.   fluticasone 50 MCG/ACT nasal spray Commonly known as:  FLONASE Place 2 sprays into both nostrils daily.   fluticasone-salmeterol 230-21 MCG/ACT inhaler Commonly known as:  ADVAIR HFA Inhale 2 puffs into the lungs 2 (two) times daily.   furosemide 40  MG tablet Commonly known as:  LASIX Take 40 mg by mouth daily.   latanoprost 0.005 % ophthalmic solution Commonly known as:  XALATAN Place 1 drop into both eyes at bedtime.   metFORMIN 500 MG tablet Commonly known as:  GLUCOPHAGE Take 1,000 mg by mouth daily.   metoprolol succinate 50 MG 24 hr tablet Commonly known as:  TOPROL-XL Take 50 mg by mouth daily. Take with or immediately following a meal.   midodrine 10 MG tablet Commonly known as:  PROAMATINE Take 10 mg by mouth 3 (three) times daily.   omeprazole 20 MG capsule Commonly known as:  PRILOSEC Take 20 mg by mouth daily.   oxyCODONE 5 MG immediate release tablet Commonly known as:  Oxy IR/ROXICODONE Take 1 tablet (5 mg total) by mouth every 4 (four) hours as needed for moderate pain.   OXYGEN Inhale 2 L into the lungs continuous.   sennosides-docusate sodium 8.6-50 MG tablet Commonly known as:  SENOKOT-S Take 1 tablet by mouth 2 (two) times daily as needed for constipation.   sertraline 50 MG tablet Commonly known as:  ZOLOFT Take 50 mg by mouth daily.   simvastatin 40 MG tablet Commonly known as:  ZOCOR Take 40 mg by mouth daily.   spironolactone 25 MG tablet Commonly known as:  ALDACTONE Take 12.5 mg by mouth daily.   traMADol 50 MG tablet Commonly known as:  ULTRAM Take 1 tablet (50 mg total) by mouth every 4 (four) hours as needed.       Review of Systems  Constitutional: Negative for activity change, appetite change,  chills, diaphoresis and fever.  HENT: Negative for congestion, mouth sores, nosebleeds, postnasal drip, sneezing, sore throat, trouble swallowing and voice change.   Respiratory: Negative for apnea, cough, choking, chest tightness, shortness of breath and wheezing.   Cardiovascular: Negative for chest pain, palpitations and leg swelling.  Gastrointestinal: Negative for abdominal distention, abdominal pain, constipation, diarrhea and nausea.  Genitourinary: Negative for difficulty urinating, dysuria, frequency and urgency.  Musculoskeletal: Positive for arthralgias (typical arthritis). Negative for back pain, gait problem and myalgias.  Skin: Negative for color change, pallor, rash and wound.  Neurological: Positive for weakness. Negative for dizziness, tremors, syncope, speech difficulty, numbness and headaches.  Psychiatric/Behavioral: Negative for agitation and behavioral problems.  All other systems reviewed and are negative.   Vitals:   01/02/17 1600  BP: (!) 155/76  Pulse: 68  Resp: 20  Temp: (!) 97.4 F (36.3 C)  TempSrc: Oral  SpO2: 100%  Weight: 119 lb 4.8 oz (54.1 kg)  Height: 5\' 9"  (1.753 m)   Body mass index is 17.62 kg/m. Physical Exam  Constitutional: He is oriented to person, place, and time. Vital signs are normal. He appears well-developed and well-nourished. He is active and cooperative. He does not appear ill. No distress.  HENT:  Head: Normocephalic and atraumatic.  Mouth/Throat: Uvula is midline, oropharynx is clear and moist and mucous membranes are normal. Mucous membranes are not pale, not dry and not cyanotic.  Eyes: Conjunctivae, EOM and lids are normal. Pupils are equal, round, and reactive to light.  Neck: Trachea normal, normal range of motion and full passive range of motion without pain. Neck supple. No JVD present. No tracheal deviation, no edema and no erythema present. No thyromegaly present.  Cardiovascular: Normal rate, regular rhythm, normal heart  sounds, intact distal pulses and normal pulses. Exam reveals no gallop, no distant heart sounds and no friction rub.  No murmur heard. Pulses:  Dorsalis pedis pulses are 2+ on the right side, and 2+ on the left side.  No edema  Pulmonary/Chest: Effort normal and breath sounds normal. No accessory muscle usage. No respiratory distress. He has no decreased breath sounds. He has no wheezes. He has no rhonchi. He has no rales. He exhibits no tenderness.  Abdominal: Soft. Normal appearance and bowel sounds are normal. He exhibits no distension and no ascites. There is no tenderness.  Musculoskeletal: He exhibits no edema or tenderness.       Left hip: He exhibits decreased range of motion and decreased strength.  Expected osteoarthritis, stiffness; Bilateral Calves soft, supple. Negative Homan's Sign. B- pedal pulses equal  Neurological: He is alert and oriented to person, place, and time. He has normal strength. He exhibits abnormal muscle tone. Coordination and gait abnormal.  Skin: Skin is warm, dry and intact. He is not diaphoretic. No cyanosis. No pallor. Nails show no clubbing.  Psychiatric: He has a normal mood and affect. His speech is normal and behavior is normal. Judgment and thought content normal. Cognition and memory are normal.  Nursing note and vitals reviewed.   Labs reviewed: Basic Metabolic Panel: Recent Labs    12/13/16 0425 12/18/16 0400 12/20/16 0610  NA 130* 127* 133*  K 4.6 4.7 4.3  CL 100* 95* 105  CO2 23 22 22   GLUCOSE 137* 112* 110*  BUN 18 25* 13  CREATININE 0.90 0.86 0.72  CALCIUM 8.8* 9.2 9.3   Liver Function Tests: Recent Labs    12/10/16 1426 12/18/16 0400 12/20/16 0610  AST 21 21 22   ALT 16* 16* 23  ALKPHOS 50 59 70  BILITOT 0.4 0.5 0.5  PROT 6.5 6.4* 6.6  ALBUMIN 3.7 3.3* 3.2*   No results for input(s): LIPASE, AMYLASE in the last 8760 hours. No results for input(s): AMMONIA in the last 8760 hours. CBC: Recent Labs    12/10/16 1426  12/12/16 0518 12/18/16 0400 12/20/16 0610  WBC 8.5 6.9 5.9 7.0  NEUTROABS 7.7*  --  4.7 5.7  HGB 9.8* 9.3* 8.6* 9.1*  HCT 29.7* 27.3* 26.3* 27.3*  MCV 90.0 90.1 88.4 90.4  PLT 129* 101* 149* 173   Cardiac Enzymes: Recent Labs    12/10/16 1426  CKTOTAL 33*  TROPONINI <0.03   BNP: Invalid input(s): POCBNP CBG: Recent Labs    12/13/16 1145 12/13/16 1327 12/13/16 1717  GLUCAP 125* 115* 145*    Procedures and Imaging Studies During Stay: Ct Head Wo Contrast  Result Date: 12/13/2016 CLINICAL DATA:  Headache and neck pain following fall EXAM: CT HEAD WITHOUT CONTRAST CT CERVICAL SPINE WITHOUT CONTRAST TECHNIQUE: Multidetector CT imaging of the head and cervical spine was performed following the standard protocol without intravenous contrast. Multiplanar CT image reconstructions of the cervical spine were also generated. COMPARISON:  Head CT September 10, 2008; brain MRI September 11, 2008 FINDINGS: CT HEAD FINDINGS Brain: There is mild diffuse atrophy. There is no intracranial mass, hemorrhage, extra-axial fluid collection, or midline shift. There is mild small vessel disease in the centra semiovale bilaterally. Elsewhere gray-white compartments appear normal. No acute infarct evident. There are foci of basal ganglia calcification bilaterally, likely physiologic in this age group. Vascular: There is no evident hyperdense vessel. There is calcification in each carotid siphon region. There is also calcification in the distal left vertebral artery. Skull: The bony calvarium appears intact.  Bones are osteoporotic. Sinuses/Orbits: There is mucosal thickening in several ethmoid air cells bilaterally. Other visualized paranasal sinuses are clear.  Orbits appear symmetric bilaterally. Other: Mastoid air cells are clear. CT CERVICAL SPINE FINDINGS Alignment: There is no appreciable spondylolisthesis. Skull base and vertebrae: Skull base and craniocervical junction regions appear normal. Bones are  diffusely osteoporotic. There is no evident fracture. There are no blastic or lytic bone lesions. Soft tissues and spinal canal: Prevertebral soft tissues and predental space regions are normal. There is no paraspinous lesion. There is no evident cord or canal hematoma. Disc levels: There is severe disc space narrowing at C6-7. There is moderately severe disc space narrowing at C5-6. There is milder disc space narrowing at C4-5. There is facet hypertrophy at multiple levels. There is exit foraminal narrowing due to bony hypertrophy on the right at C3-4 and on the right at C4-5. There is somewhat more severe narrowing due to bony hypertrophy at C5-6 and C6-7 bilaterally. There is no frank disc extrusion or high-grade stenosis. Upper chest: Visualized upper lung regions show scarring but no edema or consolidation. Other: There is calcification in each carotid artery. IMPRESSION: CT head: Mild periventricular small vessel disease with mild diffuse atrophy. No acute infarct. No mass or hemorrhage. There are foci of arterial vascular calcification. There is mucosal thickening in several ethmoid air cells. CT cervical spine: No fracture or spondylolisthesis. There is multilevel arthropathy. Bones are diffusely osteoporotic. There are foci of calcification in each carotid artery. Electronically Signed   By: Lowella Grip III M.D.   On: 12/13/2016 13:09   Ct Cervical Spine Wo Contrast  Result Date: 12/13/2016 CLINICAL DATA:  Headache and neck pain following fall EXAM: CT HEAD WITHOUT CONTRAST CT CERVICAL SPINE WITHOUT CONTRAST TECHNIQUE: Multidetector CT imaging of the head and cervical spine was performed following the standard protocol without intravenous contrast. Multiplanar CT image reconstructions of the cervical spine were also generated. COMPARISON:  Head CT September 10, 2008; brain MRI September 11, 2008 FINDINGS: CT HEAD FINDINGS Brain: There is mild diffuse atrophy. There is no intracranial mass,  hemorrhage, extra-axial fluid collection, or midline shift. There is mild small vessel disease in the centra semiovale bilaterally. Elsewhere gray-white compartments appear normal. No acute infarct evident. There are foci of basal ganglia calcification bilaterally, likely physiologic in this age group. Vascular: There is no evident hyperdense vessel. There is calcification in each carotid siphon region. There is also calcification in the distal left vertebral artery. Skull: The bony calvarium appears intact.  Bones are osteoporotic. Sinuses/Orbits: There is mucosal thickening in several ethmoid air cells bilaterally. Other visualized paranasal sinuses are clear. Orbits appear symmetric bilaterally. Other: Mastoid air cells are clear. CT CERVICAL SPINE FINDINGS Alignment: There is no appreciable spondylolisthesis. Skull base and vertebrae: Skull base and craniocervical junction regions appear normal. Bones are diffusely osteoporotic. There is no evident fracture. There are no blastic or lytic bone lesions. Soft tissues and spinal canal: Prevertebral soft tissues and predental space regions are normal. There is no paraspinous lesion. There is no evident cord or canal hematoma. Disc levels: There is severe disc space narrowing at C6-7. There is moderately severe disc space narrowing at C5-6. There is milder disc space narrowing at C4-5. There is facet hypertrophy at multiple levels. There is exit foraminal narrowing due to bony hypertrophy on the right at C3-4 and on the right at C4-5. There is somewhat more severe narrowing due to bony hypertrophy at C5-6 and C6-7 bilaterally. There is no frank disc extrusion or high-grade stenosis. Upper chest: Visualized upper lung regions show scarring but no edema or consolidation. Other: There  is calcification in each carotid artery. IMPRESSION: CT head: Mild periventricular small vessel disease with mild diffuse atrophy. No acute infarct. No mass or hemorrhage. There are foci of  arterial vascular calcification. There is mucosal thickening in several ethmoid air cells. CT cervical spine: No fracture or spondylolisthesis. There is multilevel arthropathy. Bones are diffusely osteoporotic. There are foci of calcification in each carotid artery. Electronically Signed   By: Lowella Grip III M.D.   On: 12/13/2016 13:09   Ct Pelvis Wo Contrast  Result Date: 12/10/2016 CLINICAL DATA:  Patient slipped and fell last evening complaining of left hip pain with movement. EXAM: CT PELVIS WITHOUT CONTRAST TECHNIQUE: Multidetector CT imaging of the pelvis was performed following the standard protocol without intravenous contrast. COMPARISON:  12/10/2016 pelvis and left hip radiographs. FINDINGS: Urinary Tract:  Intact urinary bladder.  No distal uropathy. Bowel:  Unremarkable visualized pelvic bowel loops. Vascular/Lymphatic: No pathologically enlarged lymph nodes. No significant vascular abnormality seen. Reproductive:  Prostate and seminal vesicles are unremarkable. Other:  No free air free fluid. Musculoskeletal: An acute, closed, coronal fracture of the acetabulum, medial wall of the acetabular and high left superior pubic ramus are noted in keeping with an anterior wall fracture of the left acetabulum. No extension into the iliac bone nor involvement of the obturator ring. No fracture of the proximal femora. IMPRESSION: Acute, comminuted, left anterior wall fracture of the left acetabulum also involving the high left superior pubic ramus. No significant displacement, joint effusion or proximal femoral fracture. Electronically Signed   By: Ashley Royalty M.D.   On: 12/10/2016 17:12   Dg Chest Portable 1 View  Result Date: 12/10/2016 CLINICAL DATA:  Left shoulder and left hip pain after fall. EXAM: PORTABLE CHEST 1 VIEW COMPARISON:  01/20/2016 FINDINGS: Left chest wall pacer device is noted with lead in the right atrial appendage and right ventricle. Normal heart size. Aortic atherosclerosis.  No pleural effusion or edema. No airspace opacities. IMPRESSION: 1. No acute cardiopulmonary abnormalities. 2.  Aortic Atherosclerosis (ICD10-I70.0). Electronically Signed   By: Kerby Moors M.D.   On: 12/10/2016 15:48   Dg Shoulder Left  Result Date: 12/10/2016 CLINICAL DATA:  Recent fall with left shoulder pain, initial encounter EXAM: LEFT SHOULDER - 2+ VIEW COMPARISON:  None. FINDINGS: Degenerative changes of the acromioclavicular joint are noted. A pacing device is seen. No acute fracture or dislocation is noted. No soft tissue abnormality is seen. IMPRESSION: Degenerative change without acute abnormality. Electronically Signed   By: Inez Catalina M.D.   On: 12/10/2016 15:44   Dg Hip Unilat W Or Wo Pelvis 2-3 Views Left  Result Date: 12/10/2016 CLINICAL DATA:  Left hip pain after fall. EXAM: DG HIP (WITH OR WITHOUT PELVIS) 2-3V LEFT COMPARISON:  None. FINDINGS: There is no evidence of hip fracture or dislocation. There is no evidence of arthropathy or other focal bone abnormality. IMPRESSION: Negative. Electronically Signed   By: Misty Stanley M.D.   On: 12/10/2016 15:41    Assessment/Plan:    Closed transverse fracture of acetabulum (HCC)  Weakness   Above condition stable  Continue current medications  Discharge home with [facility] PT[facility]OT  Continue exercises as taught by PT/OT  TDWB only  Tramadol 50 mg po Q 6 hours prn # 30, no refill  Ice prn for pain/edema  Follow up with Ortho as instructed for continuity of care  Patient is being discharged with the following home health services:  HHPT/OT  Patient is being discharged with the following durable  medical equipment: Wheelchair, [facility]   Patient has been advised to f/u with their PCP in 1-2 weeks to bring them up to date on their rehab stay.  Social services at facility was responsible for arranging this appointment.  Pt was provided with a 30 day supply of prescriptions for medications and refills must be obtained from their  PCP.  For controlled substances, a more limited supply may be provided adequate until PCP appointment only.  Patient needs a lightweight wheelchair.  Patient is able to propel self and maintain current level of independence using a wheelchair.  Patient is unable to perform these same daily functions with a walker, cane or other alternative assistive device. Pt is TDWB status to LLE only.   Future labs/tests needed:    Family/ staff Communication:   Total Time:  Documentation:  Face to Face:  Family/Phone:  Vikki Ports, NP-C [facility] [address], [place] [address] Cell Phone (Mon-Fri 8am-5pm):  [phone] On Call:  [phone] & follow prompts after 5pm & weekends Office Phone:  [phone] Office Fax:  [phone]

## 2017-01-03 ENCOUNTER — Encounter: Payer: Self-pay | Admitting: Gerontology

## 2017-01-03 NOTE — Progress Notes (Signed)
Location:   [facility] Room Number: [place] of Service:  SNF [phone]) Provider:  Toni Arthurs, NP-C  Derinda Late, MD  Patient Care Team: Derinda Late, MD as PCP - General (Family Medicine)  Extended Emergency Contact Information Primary Emergency Contact: Marjean Donna A Address: [address]          [place], [place] [address] Home Phone: [phone] Relation: None Secondary Emergency Contact: Smithhart,dwight Home Phone: [phone] Relation: Son  Code Status:  FULL Goals of care: Advanced Directive information Advanced Directives 01/02/2017  Does Patient Have a Medical Advance Directive? No  Type of Advance Directive -  Does patient want to make changes to medical advance directive? No - Patient declined  Copy of [place] in Chart? -     Chief Complaint  Patient presents with  . Medical Management of Chronic Issues    Routine Visit    HPI:  Pt is a 82 y.o. male seen today for medical management of chronic diseases. Pt was admitted to the facility for rehab following admission to [facility] for closed transverse fracture of left acetabulum and weakness. This was non-surgical. Pt is to be touchdown weight bearing for the next 12 weeks. Pt has been participating in PT and OT. Pt reports his pain is well controlled on current regimen. Family was concerned about increased confusion with the ordered Norco. Medication adjustments were made last week and has been effective. Pts appetite is down, but he is eating. Voiding without difficulty. Having regular BMs. Pt reports he is tired but feeling well. VSS. No other complaints.    Past Medical History:  Diagnosis Date  . Acute recurrent sinusitis, unspecified   . Anemia, unspecified   . B12 deficiency    diagnosed in 02/12/2008  . CAD (coronary artery disease)    atherosclerotic coronary artery disease  . Cancer ([place]) 2016   skin  . Cardiac resynchronization therapy pacemaker  (CRT-P) in place 08/17/2016  . Cardiomyopathy, secondary ([place])   . CHF (congestive heart failure) ([place])   . COPD (chronic obstructive pulmonary disease) ([place])   . Depression    unspecifed  . Diabetes mellitus without complication ([place])   . Duodenitis   . History of elevated glucose   . History of esophageal stricture   . History of gastritis   . History of gastroesophageal reflux (GERD)   . History of palpitations    chronic  . Hyperlipidemia, unspecified   . IBS (irritable bowel syndrome)   . Lipoma of back   . Memory loss    ? due to B12 deficiency. Eval Dr. Bridgett Larsson  . NAION (non-arteritic anterior ischemic optic neuropathy)    NAION left eye dx'd 4/14 by Dr. Talbert Forest felt secondary to orthostatic hypotension with hypoperfusion.  Marland Kitchen Perennial allergic rhinitis       . Type 2 diabetes, diet controlled ([place])   . VBI (vertebrobasilar insufficiency)    VBI with acute vertigo 9/10. MRI brain unremarkable   Past Surgical History:  Procedure Laterality Date  . CARDIAC CATHETERIZATION  05/1990   12/1995  . CATARACT EXTRACTION, BILATERAL  2014  . COLONOSCOPY    . ESOPHAGEAL DILATION  06/1990   Dr. Vira Agar by EGD for esophageal stricture  . KNEE ARTHROSCOPY Left 05/06/2009   Left knee arthroscopy, partial medial and lateral meniscectomies, chondroplasty of the lateral femoral condyle and patellofemoral articulation  . LIPOMA EXCISION  07/27/2008   from back  . MOHS SURGERY  skin cancer excision    Allergies  Allergen Reactions  . Citalopram Diarrhea  . Fluvastatin     Other reaction(s): Other (See Comments) REFLUX  . Paroxetine Hcl     Other reaction(s): Unknown    Allergies as of 12/21/2016   No Known Allergies     Medication List        Accurate as of 12/21/16 11:59 PM. Always use your most recent med list.          acetaminophen 325 MG tablet Commonly known as:  TYLENOL Take 650 mg by mouth 4 (four) times daily.   acetaminophen 325 MG tablet Commonly known  as:  TYLENOL Take 650 mg by mouth every 4 (four) hours as needed.   albuterol 108 (90 Base) MCG/ACT inhaler Commonly known as:  PROVENTIL HFA;VENTOLIN HFA Inhale 2 puffs into the lungs every 6 (six) hours as needed for wheezing or shortness of breath.   ASPERCREME LIDOCAINE 4 % Ptch Generic drug:  Lidocaine Apply 1 patch topically daily. Apply to left hip area  Remove after 12 hours   Cholecalciferol 4000 units Caps Take 1 capsule by mouth daily.   clonazePAM 0.5 MG tablet Commonly known as:  KLONOPIN Take 0.5 mg by mouth 2 (two) times daily as needed for anxiety.   fluticasone 50 MCG/ACT nasal spray Commonly known as:  FLONASE Place 2 sprays into both nostrils daily.   fluticasone-salmeterol 230-21 MCG/ACT inhaler Commonly known as:  ADVAIR HFA Inhale 2 puffs into the lungs 2 (two) times daily.   furosemide 40 MG tablet Commonly known as:  LASIX Take 40 mg by mouth daily.   latanoprost 0.005 % ophthalmic solution Commonly known as:  XALATAN Place 1 drop into both eyes at bedtime.   metFORMIN 500 MG tablet Commonly known as:  GLUCOPHAGE Take 1,000 mg by mouth daily.   metoprolol succinate 50 MG 24 hr tablet Commonly known as:  TOPROL-XL Take 50 mg by mouth daily. Take with or immediately following a meal.   midodrine 10 MG tablet Commonly known as:  PROAMATINE Take 10 mg by mouth 3 (three) times daily.   omeprazole 20 MG capsule Commonly known as:  PRILOSEC Take 20 mg by mouth daily.   oxyCODONE 5 MG immediate release tablet Commonly known as:  Oxy IR/ROXICODONE Take 1 tablet (5 mg total) by mouth every 4 (four) hours as needed for moderate pain.   OXYGEN Inhale 2 L into the lungs continuous.   sennosides-docusate sodium 8.6-50 MG tablet Commonly known as:  SENOKOT-S Take 1 tablet by mouth 2 (two) times daily as needed for constipation.   sertraline 50 MG tablet Commonly known as:  ZOLOFT Take 50 mg by mouth daily.   simvastatin 40 MG tablet Commonly  known as:  ZOCOR Take 40 mg by mouth daily.   spironolactone 25 MG tablet Commonly known as:  ALDACTONE Take 12.5 mg by mouth daily.   traMADol 50 MG tablet Commonly known as:  ULTRAM Take 1 tablet (50 mg total) by mouth every 4 (four) hours as needed.       Review of Systems  Constitutional: Negative for activity change, appetite change, chills, diaphoresis and fever.  HENT: Negative for congestion, mouth sores, nosebleeds, postnasal drip, sneezing, sore throat, trouble swallowing and voice change.   Respiratory: Negative for apnea, cough, choking, chest tightness, shortness of breath and wheezing.   Cardiovascular: Negative for chest pain, palpitations and leg swelling.  Gastrointestinal: Negative for abdominal distention, abdominal pain, constipation, diarrhea and nausea.  Genitourinary: Negative for difficulty urinating, dysuria, frequency and urgency.  Musculoskeletal: Positive for arthralgias (typical arthritis). Negative for back pain, gait problem and myalgias.  Skin: Negative for color change, pallor, rash and wound.  Neurological: Positive for weakness. Negative for dizziness, tremors, syncope, speech difficulty, numbness and headaches.  Psychiatric/Behavioral: Negative for agitation and behavioral problems.  All other systems reviewed and are negative.   Immunization History  Administered Date(s) Administered  . Influenza Inj Mdck Quad Pf 11/15/2016  . Influenza-Unspecified 12/31/2013, 08/24/2014, 09/27/2015, 11/15/2016  . Pneumococcal Conjugate-13 05/05/2013  . Pneumococcal Polysaccharide-23 09/07/2010, 12/07/2014  . Zoster 12/19/2010   Pertinent  Health Maintenance Due  Topic Date Due  . INFLUENZA VACCINE  Completed  . PNA vac Low Risk Adult  Completed   No flowsheet data found. Functional Status Survey:    Vitals:   12/21/16 1437  BP: (!) 175/71  Pulse: 63  Resp: 19  Temp: 97.9 F (36.6 C)  TempSrc: Oral  Weight: 126 lb (57.2 kg)  Height: 5\' 9"   (1.753 m)   Body mass index is 18.61 kg/m. Physical Exam  Constitutional: He is oriented to person, place, and time. Vital signs are normal. He appears well-developed and well-nourished. He is active and cooperative. He does not appear ill. No distress.  HENT:  Head: Normocephalic and atraumatic.  Mouth/Throat: Uvula is midline, oropharynx is clear and moist and mucous membranes are normal. Mucous membranes are not pale, not dry and not cyanotic.  Eyes: Conjunctivae, EOM and lids are normal. Pupils are equal, round, and reactive to light.  Neck: Trachea normal, normal range of motion and full passive range of motion without pain. Neck supple. No JVD present. No tracheal deviation, no edema and no erythema present. No thyromegaly present.  Cardiovascular: Normal rate, regular rhythm, normal heart sounds, intact distal pulses and normal pulses. Exam reveals no gallop, no distant heart sounds and no friction rub.  No murmur heard. Pulses:      Dorsalis pedis pulses are 2+ on the right side, and 2+ on the left side.  No edema  Pulmonary/Chest: Effort normal and breath sounds normal. No accessory muscle usage. No respiratory distress. He has no decreased breath sounds. He has no wheezes. He has no rhonchi. He has no rales. He exhibits no tenderness.  Abdominal: Soft. Normal appearance and bowel sounds are normal. He exhibits no distension and no ascites. There is no tenderness.  Musculoskeletal: He exhibits no edema or tenderness.       Left hip: He exhibits decreased range of motion, decreased strength and bony tenderness.  Expected osteoarthritis, stiffness; Bilateral Calves soft, supple. Negative Homan's Sign. B- pedal pulses equal; generalized weakness  Neurological: He is alert and oriented to person, place, and time. He has normal strength. Coordination and gait abnormal.  Skin: Skin is warm, dry and intact. He is not diaphoretic. No cyanosis. No pallor. Nails show no clubbing.  Psychiatric:  He has a normal mood and affect. His speech is normal and behavior is normal. Judgment and thought content normal. Cognition and memory are normal.  Nursing note and vitals reviewed.   Labs reviewed: Recent Labs    12/13/16 0425 12/18/16 0400 12/20/16 0610  NA 130* 127* 133*  K 4.6 4.7 4.3  CL 100* 95* 105  CO2 23 22 22   GLUCOSE 137* 112* 110*  BUN 18 25* 13  CREATININE 0.90 0.86 0.72  CALCIUM 8.8* 9.2 9.3   Recent Labs    12/10/16 1426 12/18/16 0400 12/20/16 0610  AST 21 21 22   ALT 16* 16* 23  ALKPHOS 50 59 70  BILITOT 0.4 0.5 0.5  PROT 6.5 6.4* 6.6  ALBUMIN 3.7 3.3* 3.2*   Recent Labs    12/10/16 1426 12/12/16 0518 12/18/16 0400 12/20/16 0610  WBC 8.5 6.9 5.9 7.0  NEUTROABS 7.7*  --  4.7 5.7  HGB 9.8* 9.3* 8.6* 9.1*  HCT 29.7* 27.3* 26.3* 27.3*  MCV 90.0 90.1 88.4 90.4  PLT 129* 101* 149* 173   No results found for: TSH No results found for: HGBA1C No results found for: CHOL, HDL, LDLCALC, LDLDIRECT, TRIG, CHOLHDL  Significant Diagnostic Results in last 30 days:  Ct Head Wo Contrast  Result Date: 12/13/2016 CLINICAL DATA:  Headache and neck pain following fall EXAM: CT HEAD WITHOUT CONTRAST CT CERVICAL SPINE WITHOUT CONTRAST TECHNIQUE: Multidetector CT imaging of the head and cervical spine was performed following the standard protocol without intravenous contrast. Multiplanar CT image reconstructions of the cervical spine were also generated. COMPARISON:  Head CT September 10, 2008; brain MRI September 11, 2008 FINDINGS: CT HEAD FINDINGS Brain: There is mild diffuse atrophy. There is no intracranial mass, hemorrhage, extra-axial fluid collection, or midline shift. There is mild small vessel disease in the centra semiovale bilaterally. Elsewhere gray-white compartments appear normal. No acute infarct evident. There are foci of basal ganglia calcification bilaterally, likely physiologic in this age group. Vascular: There is no evident hyperdense vessel. There is  calcification in each carotid siphon region. There is also calcification in the distal left vertebral artery. Skull: The bony calvarium appears intact.  Bones are osteoporotic. Sinuses/Orbits: There is mucosal thickening in several ethmoid air cells bilaterally. Other visualized paranasal sinuses are clear. Orbits appear symmetric bilaterally. Other: Mastoid air cells are clear. CT CERVICAL SPINE FINDINGS Alignment: There is no appreciable spondylolisthesis. Skull base and vertebrae: Skull base and craniocervical junction regions appear normal. Bones are diffusely osteoporotic. There is no evident fracture. There are no blastic or lytic bone lesions. Soft tissues and spinal canal: Prevertebral soft tissues and predental space regions are normal. There is no paraspinous lesion. There is no evident cord or canal hematoma. Disc levels: There is severe disc space narrowing at C6-7. There is moderately severe disc space narrowing at C5-6. There is milder disc space narrowing at C4-5. There is facet hypertrophy at multiple levels. There is exit foraminal narrowing due to bony hypertrophy on the right at C3-4 and on the right at C4-5. There is somewhat more severe narrowing due to bony hypertrophy at C5-6 and C6-7 bilaterally. There is no frank disc extrusion or high-grade stenosis. Upper chest: Visualized upper lung regions show scarring but no edema or consolidation. Other: There is calcification in each carotid artery. IMPRESSION: CT head: Mild periventricular small vessel disease with mild diffuse atrophy. No acute infarct. No mass or hemorrhage. There are foci of arterial vascular calcification. There is mucosal thickening in several ethmoid air cells. CT cervical spine: No fracture or spondylolisthesis. There is multilevel arthropathy. Bones are diffusely osteoporotic. There are foci of calcification in each carotid artery. Electronically Signed   By: Lowella Grip III M.D.   On: 12/13/2016 13:09   Ct Cervical  Spine Wo Contrast  Result Date: 12/13/2016 CLINICAL DATA:  Headache and neck pain following fall EXAM: CT HEAD WITHOUT CONTRAST CT CERVICAL SPINE WITHOUT CONTRAST TECHNIQUE: Multidetector CT imaging of the head and cervical spine was performed following the standard protocol without intravenous contrast. Multiplanar CT image reconstructions of the cervical spine were also  generated. COMPARISON:  Head CT September 10, 2008; brain MRI September 11, 2008 FINDINGS: CT HEAD FINDINGS Brain: There is mild diffuse atrophy. There is no intracranial mass, hemorrhage, extra-axial fluid collection, or midline shift. There is mild small vessel disease in the centra semiovale bilaterally. Elsewhere gray-white compartments appear normal. No acute infarct evident. There are foci of basal ganglia calcification bilaterally, likely physiologic in this age group. Vascular: There is no evident hyperdense vessel. There is calcification in each carotid siphon region. There is also calcification in the distal left vertebral artery. Skull: The bony calvarium appears intact.  Bones are osteoporotic. Sinuses/Orbits: There is mucosal thickening in several ethmoid air cells bilaterally. Other visualized paranasal sinuses are clear. Orbits appear symmetric bilaterally. Other: Mastoid air cells are clear. CT CERVICAL SPINE FINDINGS Alignment: There is no appreciable spondylolisthesis. Skull base and vertebrae: Skull base and craniocervical junction regions appear normal. Bones are diffusely osteoporotic. There is no evident fracture. There are no blastic or lytic bone lesions. Soft tissues and spinal canal: Prevertebral soft tissues and predental space regions are normal. There is no paraspinous lesion. There is no evident cord or canal hematoma. Disc levels: There is severe disc space narrowing at C6-7. There is moderately severe disc space narrowing at C5-6. There is milder disc space narrowing at C4-5. There is facet hypertrophy at multiple  levels. There is exit foraminal narrowing due to bony hypertrophy on the right at C3-4 and on the right at C4-5. There is somewhat more severe narrowing due to bony hypertrophy at C5-6 and C6-7 bilaterally. There is no frank disc extrusion or high-grade stenosis. Upper chest: Visualized upper lung regions show scarring but no edema or consolidation. Other: There is calcification in each carotid artery. IMPRESSION: CT head: Mild periventricular small vessel disease with mild diffuse atrophy. No acute infarct. No mass or hemorrhage. There are foci of arterial vascular calcification. There is mucosal thickening in several ethmoid air cells. CT cervical spine: No fracture or spondylolisthesis. There is multilevel arthropathy. Bones are diffusely osteoporotic. There are foci of calcification in each carotid artery. Electronically Signed   By: Lowella Grip III M.D.   On: 12/13/2016 13:09   Ct Pelvis Wo Contrast  Result Date: 12/10/2016 CLINICAL DATA:  Patient slipped and fell last evening complaining of left hip pain with movement. EXAM: CT PELVIS WITHOUT CONTRAST TECHNIQUE: Multidetector CT imaging of the pelvis was performed following the standard protocol without intravenous contrast. COMPARISON:  12/10/2016 pelvis and left hip radiographs. FINDINGS: Urinary Tract:  Intact urinary bladder.  No distal uropathy. Bowel:  Unremarkable visualized pelvic bowel loops. Vascular/Lymphatic: No pathologically enlarged lymph nodes. No significant vascular abnormality seen. Reproductive:  Prostate and seminal vesicles are unremarkable. Other:  No free air free fluid. Musculoskeletal: An acute, closed, coronal fracture of the acetabulum, medial wall of the acetabular and high left superior pubic ramus are noted in keeping with an anterior wall fracture of the left acetabulum. No extension into the iliac bone nor involvement of the obturator ring. No fracture of the proximal femora. IMPRESSION: Acute, comminuted, left  anterior wall fracture of the left acetabulum also involving the high left superior pubic ramus. No significant displacement, joint effusion or proximal femoral fracture. Electronically Signed   By: Ashley Royalty M.D.   On: 12/10/2016 17:12   Dg Chest Portable 1 View  Result Date: 12/10/2016 CLINICAL DATA:  Left shoulder and left hip pain after fall. EXAM: PORTABLE CHEST 1 VIEW COMPARISON:  01/20/2016 FINDINGS: Left chest wall pacer device  is noted with lead in the right atrial appendage and right ventricle. Normal heart size. Aortic atherosclerosis. No pleural effusion or edema. No airspace opacities. IMPRESSION: 1. No acute cardiopulmonary abnormalities. 2.  Aortic Atherosclerosis (ICD10-I70.0). Electronically Signed   By: Kerby Moors M.D.   On: 12/10/2016 15:48   Dg Shoulder Left  Result Date: 12/10/2016 CLINICAL DATA:  Recent fall with left shoulder pain, initial encounter EXAM: LEFT SHOULDER - 2+ VIEW COMPARISON:  None. FINDINGS: Degenerative changes of the acromioclavicular joint are noted. A pacing device is seen. No acute fracture or dislocation is noted. No soft tissue abnormality is seen. IMPRESSION: Degenerative change without acute abnormality. Electronically Signed   By: Inez Catalina M.D.   On: 12/10/2016 15:44   Dg Hip Unilat W Or Wo Pelvis 2-3 Views Left  Result Date: 12/10/2016 CLINICAL DATA:  Left hip pain after fall. EXAM: DG HIP (WITH OR WITHOUT PELVIS) 2-3V LEFT COMPARISON:  None. FINDINGS: There is no evidence of hip fracture or dislocation. There is no evidence of arthropathy or other focal bone abnormality. IMPRESSION: Negative. Electronically Signed   By: Misty Stanley M.D.   On: 12/10/2016 15:41    Assessment/Plan  Closed transverse fracture of acetabulum (HCC)  Weakness   Above conditions stable  Continue current medication regimen  Continue working with PT/OT  Continue exercises as taught by PT/OT  Continue scheduled Tylenol 650 mg po QID  Continue  Tramadol 50 mg po Q 4 hours prn  Aspercreme Lidocaine 4% patch to the left hip Q day  Follow up with Ortho as instructed   Family/ staff Communication:   Total Time:  Documentation:  Face to Face:  Family/Phone:   Labs/tests ordered:    Medication list reviewed and assessed for continued appropriateness. Monthly medication orders reviewed and signed.  Vikki Ports, NP-C Geriatrics [facility] [phone], [place] [address] Cell Phone (Mon-Fri 8am-5pm):  [phone] On Call:  [phone] & follow prompts after 5pm & weekends Office Phone:  [phone] Office Fax:  [phone]

## 2017-01-20 DIAGNOSIS — S32453A Displaced transverse fracture of unspecified acetabulum, initial encounter for closed fracture: Secondary | ICD-10-CM | POA: Insufficient documentation

## 2017-01-20 DIAGNOSIS — R531 Weakness: Secondary | ICD-10-CM | POA: Insufficient documentation

## 2017-09-10 ENCOUNTER — Other Ambulatory Visit: Payer: Self-pay | Admitting: Specialist

## 2017-09-10 ENCOUNTER — Ambulatory Visit
Admission: RE | Admit: 2017-09-10 | Discharge: 2017-09-10 | Disposition: A | Discharge status: Home / Self Care | Payer: Medicare Other | Source: Ambulatory Visit | Attending: Specialist | Admitting: Specialist

## 2017-09-10 DIAGNOSIS — I700 Atherosclerosis of aorta: Secondary | ICD-10-CM | POA: Insufficient documentation

## 2017-09-10 DIAGNOSIS — R079 Chest pain, unspecified: Secondary | ICD-10-CM

## 2017-09-10 DIAGNOSIS — J439 Emphysema, unspecified: Secondary | ICD-10-CM | POA: Diagnosis not present

## 2017-09-10 DIAGNOSIS — R0789 Other chest pain: Secondary | ICD-10-CM | POA: Diagnosis present

## 2017-09-10 DIAGNOSIS — R0602 Shortness of breath: Secondary | ICD-10-CM

## 2017-09-10 DIAGNOSIS — K8020 Calculus of gallbladder without cholecystitis without obstruction: Secondary | ICD-10-CM | POA: Diagnosis not present

## 2017-09-10 LAB — POCT I-STAT CREATININE: Creatinine, Ser: 0.8 mg/dL (ref 0.61–1.24)

## 2017-09-10 MED ORDER — IOHEXOL 350 MG/ML SOLN
75.0000 mL | Freq: Once | INTRAVENOUS | Status: AC | PRN
  Administered 2017-09-10: 75 mL via INTRAVENOUS

## 2017-10-26 ENCOUNTER — Other Ambulatory Visit: Payer: Self-pay

## 2017-10-26 ENCOUNTER — Emergency Department: Payer: Medicare Other

## 2017-10-26 ENCOUNTER — Encounter: Payer: Self-pay | Admitting: Emergency Medicine

## 2017-10-26 ENCOUNTER — Emergency Department
Admission: EM | Admit: 2017-10-26 | Discharge: 2017-10-26 | Disposition: A | Discharge status: Home / Self Care | Payer: Medicare Other | Attending: Emergency Medicine | Admitting: Emergency Medicine

## 2017-10-26 DIAGNOSIS — Y998 Other external cause status: Secondary | ICD-10-CM | POA: Diagnosis not present

## 2017-10-26 DIAGNOSIS — R0789 Other chest pain: Secondary | ICD-10-CM | POA: Insufficient documentation

## 2017-10-26 DIAGNOSIS — J449 Chronic obstructive pulmonary disease, unspecified: Secondary | ICD-10-CM | POA: Insufficient documentation

## 2017-10-26 DIAGNOSIS — T148XXA Other injury of unspecified body region, initial encounter: Secondary | ICD-10-CM | POA: Diagnosis not present

## 2017-10-26 DIAGNOSIS — Z79899 Other long term (current) drug therapy: Secondary | ICD-10-CM | POA: Insufficient documentation

## 2017-10-26 DIAGNOSIS — Z7984 Long term (current) use of oral hypoglycemic drugs: Secondary | ICD-10-CM | POA: Diagnosis not present

## 2017-10-26 DIAGNOSIS — E785 Hyperlipidemia, unspecified: Secondary | ICD-10-CM | POA: Diagnosis not present

## 2017-10-26 DIAGNOSIS — Z87891 Personal history of nicotine dependence: Secondary | ICD-10-CM | POA: Diagnosis not present

## 2017-10-26 DIAGNOSIS — W06XXXA Fall from bed, initial encounter: Secondary | ICD-10-CM | POA: Diagnosis not present

## 2017-10-26 DIAGNOSIS — Y92003 Bedroom of unspecified non-institutional (private) residence as the place of occurrence of the external cause: Secondary | ICD-10-CM | POA: Diagnosis not present

## 2017-10-26 DIAGNOSIS — E119 Type 2 diabetes mellitus without complications: Secondary | ICD-10-CM | POA: Insufficient documentation

## 2017-10-26 DIAGNOSIS — I509 Heart failure, unspecified: Secondary | ICD-10-CM | POA: Diagnosis not present

## 2017-10-26 DIAGNOSIS — I2510 Atherosclerotic heart disease of native coronary artery without angina pectoris: Secondary | ICD-10-CM | POA: Diagnosis not present

## 2017-10-26 DIAGNOSIS — Y939 Activity, unspecified: Secondary | ICD-10-CM | POA: Insufficient documentation

## 2017-10-26 DIAGNOSIS — T07XXXA Unspecified multiple injuries, initial encounter: Secondary | ICD-10-CM

## 2017-10-26 DIAGNOSIS — R079 Chest pain, unspecified: Secondary | ICD-10-CM | POA: Diagnosis present

## 2017-10-26 DIAGNOSIS — W19XXXA Unspecified fall, initial encounter: Secondary | ICD-10-CM

## 2017-10-26 LAB — CBC WITH DIFFERENTIAL/PLATELET
ABS IMMATURE GRANULOCYTES: 0.05 10*3/uL (ref 0.00–0.07)
Basophils Absolute: 0 10*3/uL (ref 0.0–0.1)
Basophils Relative: 0 %
Eosinophils Absolute: 0.1 10*3/uL (ref 0.0–0.5)
Eosinophils Relative: 1 %
HEMATOCRIT: 34.7 % — AB (ref 39.0–52.0)
Hemoglobin: 11 g/dL — ABNORMAL LOW (ref 13.0–17.0)
Immature Granulocytes: 1 %
LYMPHS ABS: 0.9 10*3/uL (ref 0.7–4.0)
LYMPHS PCT: 12 %
MCH: 28.1 pg (ref 26.0–34.0)
MCHC: 31.7 g/dL (ref 30.0–36.0)
MCV: 88.7 fL (ref 80.0–100.0)
MONO ABS: 0.6 10*3/uL (ref 0.1–1.0)
MONOS PCT: 9 %
NEUTROS ABS: 5.7 10*3/uL (ref 1.7–7.7)
Neutrophils Relative %: 77 %
Platelets: 123 10*3/uL — ABNORMAL LOW (ref 150–400)
RBC: 3.91 MIL/uL — ABNORMAL LOW (ref 4.22–5.81)
RDW: 14.2 % (ref 11.5–15.5)
WBC: 7.4 10*3/uL (ref 4.0–10.5)
nRBC: 0 % (ref 0.0–0.2)

## 2017-10-26 LAB — COMPREHENSIVE METABOLIC PANEL
ALT: 14 U/L (ref 0–44)
AST: 19 U/L (ref 15–41)
Albumin: 3.9 g/dL (ref 3.5–5.0)
Alkaline Phosphatase: 53 U/L (ref 38–126)
Anion gap: 10 (ref 5–15)
BUN: 10 mg/dL (ref 8–23)
CO2: 23 mmol/L (ref 22–32)
Calcium: 9.1 mg/dL (ref 8.9–10.3)
Chloride: 98 mmol/L (ref 98–111)
Creatinine, Ser: 0.97 mg/dL (ref 0.61–1.24)
Glucose, Bld: 138 mg/dL — ABNORMAL HIGH (ref 70–99)
Potassium: 4.3 mmol/L (ref 3.5–5.1)
SODIUM: 131 mmol/L — AB (ref 135–145)
Total Bilirubin: 0.6 mg/dL (ref 0.3–1.2)
Total Protein: 7.1 g/dL (ref 6.5–8.1)

## 2017-10-26 LAB — TROPONIN I

## 2017-10-26 NOTE — ED Provider Notes (Signed)
[facility] Emergency Department Provider Note  Time seen: 8:17 AM  I have reviewed the triage vital signs and the nursing notes.   HISTORY  Chief Complaint Fall and Chest Pain    HPI Ruben Flores is a 82 y.o. male with a past medical history of anemia, CAD, CHF, COPD, diabetes, hyperlipidemia, frequent falls, presents to the emergency department after a fall.  According to the patient he thinks either got up out of bed and rolled out of the bed he is not quite sure, but fell next to his bed.  Per son patient has a history of frequent falls, they have been told in the past this is likely due to orthostatic hypotension from the patient standing up too quickly.  They have taken the patient off most of his blood pressure medications but he continues to have symptoms from time to time.  Patient does not remember if he rolled out of bed or try to stand up, son states patient does have some short-term memory issues.  Patient's only pain complaint currently is central chest pain and mild right shoulder discomfort.  Patient does have abrasions to the right arm, as well as his forehead and face.   Past Medical History:  Diagnosis Date  . Acute recurrent sinusitis, unspecified   . Anemia, unspecified   . B12 deficiency    diagnosed in 02/12/2008  . CAD (coronary artery disease)    atherosclerotic coronary artery disease  . Cancer ([place]) 2016   skin  . Cardiac resynchronization therapy pacemaker (CRT-P) in place 08/17/2016  . Cardiomyopathy, secondary ([place])   . CHF (congestive heart failure) ([place])   . COPD (chronic obstructive pulmonary disease) ([place])   . Depression    unspecifed  . Diabetes mellitus without complication ([place])   . Duodenitis   . History of elevated glucose   . History of esophageal stricture   . History of gastritis   . History of gastroesophageal reflux (GERD)   . History of palpitations    chronic  . Hyperlipidemia, unspecified   . IBS  (irritable bowel syndrome)   . Lipoma of back   . Memory loss    ? due to B12 deficiency. Eval Dr. Bridgett Larsson  . NAION (non-arteritic anterior ischemic optic neuropathy)    NAION left eye dx'd 4/14 by Dr. Talbert Forest felt secondary to orthostatic hypotension with hypoperfusion.  Marland Kitchen Perennial allergic rhinitis       . Type 2 diabetes, diet controlled ([place])   . VBI (vertebrobasilar insufficiency)    VBI with acute vertigo 9/10. MRI brain unremarkable    Patient Active Problem List   Diagnosis Date Noted  . Closed transverse fracture of acetabulum ([place]) 01/20/2017  . Weakness 01/20/2017  . Hip fracture ([place]) 12/10/2016    Past Surgical History:  Procedure Laterality Date  . CARDIAC CATHETERIZATION  05/1990   12/1995  . CATARACT EXTRACTION, BILATERAL  2014  . COLONOSCOPY    . ESOPHAGEAL DILATION  06/1990   Dr. Vira Agar by EGD for esophageal stricture  . KNEE ARTHROSCOPY Left 05/06/2009   Left knee arthroscopy, partial medial and lateral meniscectomies, chondroplasty of the lateral femoral condyle and patellofemoral articulation  . LIPOMA EXCISION  07/27/2008   from back  . MOHS SURGERY     skin cancer excision    Prior to Admission medications   Medication Sig Start Date End Date Taking? Authorizing Provider  acetaminophen (TYLENOL) 325 MG tablet Take 650 mg by mouth 4 (four) times daily.  [provider]  acetaminophen (TYLENOL) 325 MG tablet Take 650 mg by mouth every 4 (four) hours as needed.    [provider]  albuterol (PROVENTIL HFA;VENTOLIN HFA) 108 (90 Base) MCG/ACT inhaler Inhale 2 puffs into the lungs every 6 (six) hours as needed for wheezing or shortness of breath.    [provider]  Cholecalciferol 4000 units CAPS Take 1 capsule by mouth daily.    [provider]  clonazePAM (KLONOPIN) 0.5 MG tablet Take 0.5 mg by mouth 2 (two) times daily as needed for anxiety.    [provider]  fluticasone (FLONASE) 50 MCG/ACT nasal spray  Place 2 sprays into both nostrils daily.    [provider]  fluticasone-salmeterol (ADVAIR HFA) 230-21 MCG/ACT inhaler Inhale 2 puffs into the lungs 2 (two) times daily.    [provider]  furosemide (LASIX) 40 MG tablet Take 40 mg by mouth daily.    [provider]  latanoprost (XALATAN) 0.005 % ophthalmic solution Place 1 drop into both eyes at bedtime.    [provider]  Lidocaine (ASPERCREME LIDOCAINE) 4 % PTCH Apply 1 patch topically daily. Apply to left hip area  Remove after 12 hours    [provider]  metFORMIN (GLUCOPHAGE) 500 MG tablet Take 1,000 mg by mouth daily.     [provider]  metoprolol succinate (TOPROL-XL) 50 MG 24 hr tablet Take 50 mg by mouth daily. Take with or immediately following a meal.    [provider]  midodrine (PROAMATINE) 10 MG tablet Take 10 mg by mouth 3 (three) times daily.    [provider]  omeprazole (PRILOSEC) 20 MG capsule Take 20 mg by mouth daily.    [provider]  oxyCODONE (OXY IR/ROXICODONE) 5 MG immediate release tablet Take 1 tablet (5 mg total) by mouth every 4 (four) hours as needed for moderate pain. 12/17/16   Toni Arthurs, NP  OXYGEN Inhale 2 L into the lungs continuous.    [provider]  sennosides-docusate sodium (SENOKOT-S) 8.6-50 MG tablet Take 1 tablet by mouth 2 (two) times daily as needed for constipation.    [provider]  sertraline (ZOLOFT) 50 MG tablet Take 50 mg by mouth daily.     [provider]  simvastatin (ZOCOR) 40 MG tablet Take 40 mg by mouth daily.    [provider]  spironolactone (ALDACTONE) 25 MG tablet Take 12.5 mg by mouth daily.     [provider]  traMADol (ULTRAM) 50 MG tablet Take 1 tablet (50 mg total) by mouth every 4 (four) hours as needed. 12/17/16   Toni Arthurs, NP    Allergies  Allergen Reactions  . Citalopram Diarrhea  . Fluvastatin     Other reaction(s): Other  (See Comments) REFLUX  . Paroxetine Hcl     Other reaction(s): Unknown    Family History  Problem Relation Age of Onset  . Pancreatic cancer Mother   . Heart attack Father   . Heart attack Sister        x2  . Heart disease Sister   . Diabetes Brother   . Heart disease Brother   . Stroke Maternal Grandmother   . Irregular heart beat Maternal Grandmother   . Heart attack Paternal Uncle   . Heart attack Paternal Uncle   . Heart attack Paternal Uncle   . Irritable bowel syndrome Sister   . Cancer Sister   . Anesthesia problems Neg Hx  Social History Social History   Tobacco Use  . Smoking status: Former Smoker    Packs/day: 1.00    Years: 30.00    Pack years: 30.00    Types: Cigarettes    Last attempt to quit: 01/01/1993    Years since quitting: 24.8  . Smokeless tobacco: Current User    Types: Chew  . Tobacco comment: quit 20 years ago  Substance Use Topics  . Alcohol use: No    Frequency: Never  . Drug use: Not on file    Review of Systems Constitutional: Possible LOC. ENT: Abrasion to bridge of nose Cardiovascular: Central chest discomfort Respiratory: Negative for shortness of breath. Gastrointestinal: Negative for abdominal pain, vomiting Musculoskeletal: Central chest pain, mild right shoulder pain. Skin: Negative for skin complaints  Neurological: Negative for headache All other ROS negative  ____________________________________________   PHYSICAL EXAM:  VITAL SIGNS: ED Triage Vitals [10/26/17 0725]  Enc Vitals Group     BP      Pulse      Resp      Temp 97.7 F (36.5 C)     Temp Source Oral     SpO2      Weight 140 lb (63.5 kg)     Height 5\' 8"  (1.727 m)     Head Circumference      Peak Flow      Pain Score 0     Pain Loc      Pain Edu?      Excl. in [place]?     Constitutional: Alert and oriented. Well appearing and in no distress. Eyes: Normal exam ENT   Head: Normocephalic and atraumatic.   Mouth/Throat: Mucous membranes  are moist. Cardiovascular: Normal rate, regular rhythm. Respiratory: Normal respiratory effort without tachypnea nor retractions. Breath sounds are clear.  Moderate central chest tenderness to palpation. Gastrointestinal: Soft and nontender. No distention.   Musculoskeletal: Nontender with normal range of motion in all extremities.  Patient has abrasions to the right upper extremity with several small skin tears, all of which are hemostatic.  Small skin tear to left elbow, hemostatic. Neurologic:  Normal speech and language. No gross focal neurologic deficits  Skin:  Skin is warm.  Abrasions as described above the bilateral upper extremities.  Small abrasion to bridge of nose. Psychiatric: Mood and affect are normal.   ____________________________________________    EKG EKG reviewed and interpreted by myself shows what appears to be a paced rhythm around 90 bpm.  Significant electrical interference due to mild tremor.  Appears to have a normal QT, slightly widened QRS, nonspecific ST changes.  ____________________________________________    RADIOLOGY  Chest x-ray negative. CT scan of the chest is negative CT scan of the head is negative.  ____________________________________________   INITIAL IMPRESSION / ASSESSMENT AND PLAN / ED COURSE  Pertinent labs & imaging results that were available during my care of the patient were reviewed by me and considered in my medical decision making (see chart for details).  Patient presents to the emergency department after a fall.  Patient is complaining of chest pain although it is extremely reproducible on examination highly suspect muscular skeletal pain.  EKG although significant electrical interference does not appear to show any concerning ST changes.  We will obtain a CT scan of the head as a precaution and continue to closely monitor.  We will cover skin tears with Xeroform dressing.  All of which are hemostatic currently.  Patient's  work-up is largely reassuring, given the  chest tenderness to palpation despite a negative chest x-ray ordered a CT scan of the chest however CT scan the chest is also negative.  CT scan of the head is negative.  Patient continues to appear well, states he is feeling much better.  The patient will be discharged home with family.  Patient agreeable to plan of care.  ____________________________________________   FINAL CLINICAL IMPRESSION(S) / ED DIAGNOSES  Fall Abrasions Chest contusion   Harvest Dark, MD 10/26/17 [phone]

## 2017-10-26 NOTE — ED Triage Notes (Signed)
Pt arrives via acems with c/o fall. Pt's wife told ems that the pt got up to get out of bed and face planted. Pt fell face first and hit the bridge of his nose. Noted skin tears to right hand and forearm that were bandaged before arrival. Pt on 2L chronically. Pt c/o sternal pain. Hx of diabetes, COPD, hypotension, and pacemaker.

## 2017-10-26 NOTE — ED Notes (Signed)
Skin tears dressed with xeroform and guaze, per Dr. Kerman Passey.

## 2017-11-28 ENCOUNTER — Inpatient Hospital Stay: Payer: Medicare Other

## 2017-11-28 ENCOUNTER — Emergency Department: Payer: Medicare Other

## 2017-11-28 ENCOUNTER — Other Ambulatory Visit: Payer: Self-pay

## 2017-11-28 ENCOUNTER — Inpatient Hospital Stay
Admission: EM | Admit: 2017-11-28 | Discharge: 2017-12-01 | DRG: 871 | Disposition: E | Payer: Medicare Other | Attending: Internal Medicine | Admitting: Internal Medicine

## 2017-11-28 DIAGNOSIS — Z9842 Cataract extraction status, left eye: Secondary | ICD-10-CM

## 2017-11-28 DIAGNOSIS — E869 Volume depletion, unspecified: Secondary | ICD-10-CM | POA: Diagnosis present

## 2017-11-28 DIAGNOSIS — D649 Anemia, unspecified: Secondary | ICD-10-CM | POA: Diagnosis present

## 2017-11-28 DIAGNOSIS — A419 Sepsis, unspecified organism: Secondary | ICD-10-CM | POA: Diagnosis present

## 2017-11-28 DIAGNOSIS — R402432 Glasgow coma scale score 3-8, at arrival to emergency department: Secondary | ICD-10-CM | POA: Diagnosis present

## 2017-11-28 DIAGNOSIS — E875 Hyperkalemia: Secondary | ICD-10-CM | POA: Diagnosis present

## 2017-11-28 DIAGNOSIS — E538 Deficiency of other specified B group vitamins: Secondary | ICD-10-CM | POA: Diagnosis present

## 2017-11-28 DIAGNOSIS — Z66 Do not resuscitate: Secondary | ICD-10-CM | POA: Diagnosis not present

## 2017-11-28 DIAGNOSIS — Z87891 Personal history of nicotine dependence: Secondary | ICD-10-CM

## 2017-11-28 DIAGNOSIS — I429 Cardiomyopathy, unspecified: Secondary | ICD-10-CM | POA: Diagnosis present

## 2017-11-28 DIAGNOSIS — I469 Cardiac arrest, cause unspecified: Secondary | ICD-10-CM | POA: Diagnosis not present

## 2017-11-28 DIAGNOSIS — E785 Hyperlipidemia, unspecified: Secondary | ICD-10-CM | POA: Diagnosis present

## 2017-11-28 DIAGNOSIS — R0902 Hypoxemia: Secondary | ICD-10-CM

## 2017-11-28 DIAGNOSIS — Z7951 Long term (current) use of inhaled steroids: Secondary | ICD-10-CM

## 2017-11-28 DIAGNOSIS — F329 Major depressive disorder, single episode, unspecified: Secondary | ICD-10-CM | POA: Diagnosis present

## 2017-11-28 DIAGNOSIS — R000 Tachycardia, unspecified: Secondary | ICD-10-CM

## 2017-11-28 DIAGNOSIS — E872 Acidosis: Secondary | ICD-10-CM | POA: Diagnosis present

## 2017-11-28 DIAGNOSIS — Z800 Family history of malignant neoplasm of digestive organs: Secondary | ICD-10-CM | POA: Diagnosis not present

## 2017-11-28 DIAGNOSIS — I5022 Chronic systolic (congestive) heart failure: Secondary | ICD-10-CM | POA: Diagnosis present

## 2017-11-28 DIAGNOSIS — E119 Type 2 diabetes mellitus without complications: Secondary | ICD-10-CM | POA: Diagnosis present

## 2017-11-28 DIAGNOSIS — R0602 Shortness of breath: Secondary | ICD-10-CM | POA: Diagnosis present

## 2017-11-28 DIAGNOSIS — G450 Vertebro-basilar artery syndrome: Secondary | ICD-10-CM | POA: Diagnosis present

## 2017-11-28 DIAGNOSIS — Z7984 Long term (current) use of oral hypoglycemic drugs: Secondary | ICD-10-CM

## 2017-11-28 DIAGNOSIS — E871 Hypo-osmolality and hyponatremia: Secondary | ICD-10-CM | POA: Diagnosis present

## 2017-11-28 DIAGNOSIS — Z9841 Cataract extraction status, right eye: Secondary | ICD-10-CM | POA: Diagnosis not present

## 2017-11-28 DIAGNOSIS — Z8249 Family history of ischemic heart disease and other diseases of the circulatory system: Secondary | ICD-10-CM | POA: Diagnosis not present

## 2017-11-28 DIAGNOSIS — Z9981 Dependence on supplemental oxygen: Secondary | ICD-10-CM | POA: Diagnosis not present

## 2017-11-28 DIAGNOSIS — Z823 Family history of stroke: Secondary | ICD-10-CM

## 2017-11-28 DIAGNOSIS — J189 Pneumonia, unspecified organism: Secondary | ICD-10-CM | POA: Diagnosis present

## 2017-11-28 DIAGNOSIS — Z833 Family history of diabetes mellitus: Secondary | ICD-10-CM | POA: Diagnosis not present

## 2017-11-28 DIAGNOSIS — I509 Heart failure, unspecified: Secondary | ICD-10-CM

## 2017-11-28 DIAGNOSIS — J440 Chronic obstructive pulmonary disease with acute lower respiratory infection: Secondary | ICD-10-CM | POA: Diagnosis present

## 2017-11-28 DIAGNOSIS — Z85828 Personal history of other malignant neoplasm of skin: Secondary | ICD-10-CM | POA: Diagnosis not present

## 2017-11-28 DIAGNOSIS — I2510 Atherosclerotic heart disease of native coronary artery without angina pectoris: Secondary | ICD-10-CM | POA: Diagnosis present

## 2017-11-28 DIAGNOSIS — Z7982 Long term (current) use of aspirin: Secondary | ICD-10-CM

## 2017-11-28 DIAGNOSIS — D696 Thrombocytopenia, unspecified: Secondary | ICD-10-CM | POA: Diagnosis present

## 2017-11-28 DIAGNOSIS — K589 Irritable bowel syndrome without diarrhea: Secondary | ICD-10-CM | POA: Diagnosis present

## 2017-11-28 DIAGNOSIS — J9621 Acute and chronic respiratory failure with hypoxia: Secondary | ICD-10-CM | POA: Diagnosis present

## 2017-11-28 DIAGNOSIS — H47012 Ischemic optic neuropathy, left eye: Secondary | ICD-10-CM | POA: Diagnosis present

## 2017-11-28 DIAGNOSIS — Z950 Presence of cardiac pacemaker: Secondary | ICD-10-CM

## 2017-11-28 DIAGNOSIS — T502X5A Adverse effect of carbonic-anhydrase inhibitors, benzothiadiazides and other diuretics, initial encounter: Secondary | ICD-10-CM | POA: Diagnosis present

## 2017-11-28 DIAGNOSIS — Z888 Allergy status to other drugs, medicaments and biological substances status: Secondary | ICD-10-CM

## 2017-11-28 DIAGNOSIS — R112 Nausea with vomiting, unspecified: Secondary | ICD-10-CM

## 2017-11-28 LAB — COMPREHENSIVE METABOLIC PANEL
ALT: 19 U/L (ref 0–44)
AST: 29 U/L (ref 15–41)
Albumin: 3.6 g/dL (ref 3.5–5.0)
Alkaline Phosphatase: 52 U/L (ref 38–126)
Anion gap: 13 (ref 5–15)
BILIRUBIN TOTAL: 1.5 mg/dL — AB (ref 0.3–1.2)
BUN: 16 mg/dL (ref 8–23)
CHLORIDE: 91 mmol/L — AB (ref 98–111)
CO2: 17 mmol/L — ABNORMAL LOW (ref 22–32)
Calcium: 8.9 mg/dL (ref 8.9–10.3)
Creatinine, Ser: 1.15 mg/dL (ref 0.61–1.24)
Glucose, Bld: 243 mg/dL — ABNORMAL HIGH (ref 70–99)
POTASSIUM: 5.3 mmol/L — AB (ref 3.5–5.1)
Sodium: 121 mmol/L — ABNORMAL LOW (ref 135–145)
Total Protein: 6.6 g/dL (ref 6.5–8.1)

## 2017-11-28 LAB — CBC WITH DIFFERENTIAL/PLATELET
Abs Immature Granulocytes: 0.17 10*3/uL — ABNORMAL HIGH (ref 0.00–0.07)
Abs Immature Granulocytes: 0.93 10*3/uL — ABNORMAL HIGH (ref 0.00–0.07)
BASOS PCT: 0 %
Basophils Absolute: 0 10*3/uL (ref 0.0–0.1)
Basophils Absolute: 0.1 10*3/uL (ref 0.0–0.1)
Basophils Relative: 0 %
Eosinophils Absolute: 0 10*3/uL (ref 0.0–0.5)
Eosinophils Absolute: 0 10*3/uL (ref 0.0–0.5)
Eosinophils Relative: 0 %
Eosinophils Relative: 0 %
HCT: 30.7 % — ABNORMAL LOW (ref 39.0–52.0)
HCT: 31 % — ABNORMAL LOW (ref 39.0–52.0)
Hemoglobin: 9.9 g/dL — ABNORMAL LOW (ref 13.0–17.0)
Hemoglobin: 9.2 g/dL — ABNORMAL LOW (ref 13.0–17.0)
IMMATURE GRANULOCYTES: 1 %
Immature Granulocytes: 4 %
Lymphocytes Relative: 3 %
Lymphocytes Relative: 11 %
Lymphs Abs: 0.4 10*3/uL — ABNORMAL LOW (ref 0.7–4.0)
Lymphs Abs: 2.8 10*3/uL (ref 0.7–4.0)
MCH: 28 pg (ref 26.0–34.0)
MCH: 27.7 pg (ref 26.0–34.0)
MCHC: 32.2 g/dL (ref 30.0–36.0)
MCHC: 29.7 g/dL — ABNORMAL LOW (ref 30.0–36.0)
MCV: 86.7 fL (ref 80.0–100.0)
MCV: 93.4 fL (ref 80.0–100.0)
MONO ABS: 1.3 10*3/uL — AB (ref 0.1–1.0)
MONOS PCT: 8 %
Monocytes Absolute: 1.5 10*3/uL — ABNORMAL HIGH (ref 0.1–1.0)
Monocytes Relative: 6 %
NEUTROS PCT: 88 %
Neutro Abs: 14 10*3/uL — ABNORMAL HIGH (ref 1.7–7.7)
Neutro Abs: 20.3 10*3/uL — ABNORMAL HIGH (ref 1.7–7.7)
Neutrophils Relative %: 79 %
PLATELETS: 137 10*3/uL — AB (ref 150–400)
Platelets: 135 10*3/uL — ABNORMAL LOW (ref 150–400)
RBC: 3.54 MIL/uL — AB (ref 4.22–5.81)
RBC: 3.32 MIL/uL — ABNORMAL LOW (ref 4.22–5.81)
RDW: 14.6 % (ref 11.5–15.5)
RDW: 14.6 % (ref 11.5–15.5)
WBC: 15.9 10*3/uL — AB (ref 4.0–10.5)
WBC: 25.5 10*3/uL — ABNORMAL HIGH (ref 4.0–10.5)
nRBC: 0.1 % (ref 0.0–0.2)
nRBC: 0.1 % (ref 0.0–0.2)

## 2017-11-28 LAB — BASIC METABOLIC PANEL
Anion gap: 13 (ref 5–15)
BUN: 16 mg/dL (ref 8–23)
CALCIUM: 7.5 mg/dL — AB (ref 8.9–10.3)
CO2: 14 mmol/L — ABNORMAL LOW (ref 22–32)
Chloride: 96 mmol/L — ABNORMAL LOW (ref 98–111)
Creatinine, Ser: 1.22 mg/dL (ref 0.61–1.24)
GFR calc non Af Amer: 56 mL/min — ABNORMAL LOW (ref >60)
Glucose, Bld: 375 mg/dL — ABNORMAL HIGH (ref 70–99)
Potassium: 4.9 mmol/L (ref 3.5–5.1)
Sodium: 123 mmol/L — ABNORMAL LOW (ref 135–145)

## 2017-11-28 LAB — INFLUENZA PANEL BY PCR (TYPE A & B)
Influenza A By PCR: NEGATIVE
Influenza B By PCR: NEGATIVE

## 2017-11-28 LAB — URINALYSIS, COMPLETE (UACMP) WITH MICROSCOPIC
Bacteria, UA: NONE SEEN
Bilirubin Urine: NEGATIVE
Glucose, UA: 50 mg/dL — AB
Ketones, ur: NEGATIVE mg/dL
Leukocytes, UA: NEGATIVE
Nitrite: NEGATIVE
Protein, ur: NEGATIVE mg/dL
Specific Gravity, Urine: 1.018 (ref 1.005–1.030)
pH: 5 (ref 5.0–8.0)

## 2017-11-28 LAB — BLOOD GAS, ARTERIAL
Acid-base deficit: 11 mmol/L — ABNORMAL HIGH (ref 0.0–2.0)
Bicarbonate: 17.2 mmol/L — ABNORMAL LOW (ref 20.0–28.0)
FIO2: 1
O2 Saturation: 83.6 %
PCO2 ART: 46 mmHg (ref 32.0–48.0)
Patient temperature: 37
pH, Arterial: 7.18 — CL (ref 7.350–7.450)
pO2, Arterial: 61 mmHg — ABNORMAL LOW (ref 83.0–108.0)

## 2017-11-28 LAB — TROPONIN I: TROPONIN I: 0.03 ng/mL — AB (ref <0.03)

## 2017-11-28 LAB — BRAIN NATRIURETIC PEPTIDE: B Natriuretic Peptide: 685 pg/mL — ABNORMAL HIGH (ref 0.0–100.0)

## 2017-11-28 LAB — APTT: APTT: 69 s — AB (ref 24–36)

## 2017-11-28 LAB — LACTIC ACID, PLASMA: Lactic Acid, Venous: 7.3 mmol/L (ref 0.5–1.9)

## 2017-11-28 LAB — MAGNESIUM: Magnesium: 2.3 mg/dL (ref 1.7–2.4)

## 2017-11-28 LAB — CG4 I-STAT (LACTIC ACID): Lactic Acid, Venous: 2.87 mmol/L (ref 0.5–1.9)

## 2017-11-28 LAB — PROTIME-INR
INR: 1.92
Prothrombin Time: 21.7 seconds — ABNORMAL HIGH (ref 11.4–15.2)

## 2017-11-28 LAB — PROCALCITONIN: Procalcitonin: 0.11 ng/mL

## 2017-11-28 LAB — GLUCOSE, CAPILLARY: Glucose-Capillary: 223 mg/dL — ABNORMAL HIGH (ref 70–99)

## 2017-11-28 LAB — PHOSPHORUS: Phosphorus: 6.4 mg/dL — ABNORMAL HIGH (ref 2.5–4.6)

## 2017-11-28 MED ORDER — SIMVASTATIN 20 MG PO TABS: 40.0000 mg | ORAL_TABLET | Freq: Every day | ORAL | Status: DC

## 2017-11-28 MED ORDER — METOPROLOL SUCCINATE ER 50 MG PO TB24
50.0000 mg | ORAL_TABLET | Freq: Every day | ORAL | Status: DC
  Administered 2017-11-28: 50 mg via ORAL
  Filled 2017-11-28: qty 1

## 2017-11-28 MED ORDER — ACETAMINOPHEN 500 MG PO TABS
1000.0000 mg | ORAL_TABLET | Freq: Once | ORAL | Status: AC
  Administered 2017-11-28: 1000 mg via ORAL
  Filled 2017-11-28: qty 2

## 2017-11-28 MED ORDER — ALBUTEROL SULFATE HFA 108 (90 BASE) MCG/ACT IN AERS: 2.0000 | INHALATION_SPRAY | Freq: Four times a day (QID) | RESPIRATORY_TRACT | Status: DC | PRN

## 2017-11-28 MED ORDER — ALBUTEROL SULFATE (2.5 MG/3ML) 0.083% IN NEBU: 2.5000 mg | INHALATION_SOLUTION | RESPIRATORY_TRACT | Status: DC | PRN

## 2017-11-28 MED ORDER — ACETAMINOPHEN 325 MG PO TABS: 650.0000 mg | ORAL_TABLET | Freq: Four times a day (QID) | ORAL | Status: DC

## 2017-11-28 MED ORDER — SODIUM BICARBONATE 8.4 % IV SOLN
INTRAVENOUS | Status: DC
  Filled 2017-11-28 (×3): qty 100

## 2017-11-28 MED ORDER — SODIUM CHLORIDE 0.9 % IV SOLN
2.0000 g | Freq: Once | INTRAVENOUS | Status: AC
  Administered 2017-11-28: 2 g via INTRAVENOUS
  Filled 2017-11-28: qty 2

## 2017-11-28 MED ORDER — FLUTICASONE PROPIONATE 50 MCG/ACT NA SUSP
2.0000 | Freq: Every day | NASAL | Status: DC
  Filled 2017-11-28: qty 16

## 2017-11-28 MED ORDER — STERILE WATER FOR INJECTION IV SOLN
INTRAVENOUS | Status: DC
  Filled 2017-11-28 (×3): qty 850

## 2017-11-28 MED ORDER — ACETAMINOPHEN 650 MG RE SUPP: 650.0000 mg | Freq: Four times a day (QID) | RECTAL | Status: DC | PRN

## 2017-11-28 MED ORDER — FENTANYL CITRATE (PF) 100 MCG/2ML IJ SOLN
INTRAMUSCULAR | Status: AC
  Filled 2017-11-28: qty 2

## 2017-11-28 MED ORDER — FENTANYL 2500MCG IN NS 250ML (10MCG/ML) PREMIX INFUSION
INTRAVENOUS | Status: AC
  Filled 2017-11-28: qty 250

## 2017-11-28 MED ORDER — FENTANYL CITRATE (PF) 100 MCG/2ML IJ SOLN: 12.5000 ug | INTRAMUSCULAR | Status: DC | PRN

## 2017-11-28 MED ORDER — SODIUM CHLORIDE 0.9 % IV BOLUS
1000.0000 mL | Freq: Once | INTRAVENOUS | Status: AC
  Administered 2017-11-28: 1000 mL via INTRAVENOUS

## 2017-11-28 MED ORDER — EPINEPHRINE PF 1 MG/ML IJ SOLN
0.5000 ug/min | INTRAVENOUS | Status: DC
  Filled 2017-11-28: qty 4

## 2017-11-28 MED ORDER — IPRATROPIUM-ALBUTEROL 0.5-2.5 (3) MG/3ML IN SOLN
3.0000 mL | Freq: Once | RESPIRATORY_TRACT | Status: AC
  Administered 2017-11-28: 3 mL via RESPIRATORY_TRACT
  Filled 2017-11-28: qty 3

## 2017-11-28 MED ORDER — METOPROLOL TARTRATE 5 MG/5ML IV SOLN: 2.5000 mg | Freq: Once | INTRAVENOUS | Status: DC

## 2017-11-28 MED ORDER — SERTRALINE HCL 50 MG PO TABS: 50.0000 mg | ORAL_TABLET | Freq: Every day | ORAL | Status: DC

## 2017-11-28 MED ORDER — SODIUM CHLORIDE 0.9 % IV SOLN
1000.0000 mL | INTRAVENOUS | Status: DC
  Administered 2017-11-28: 1000 mL via INTRAVENOUS

## 2017-11-28 MED ORDER — AMIODARONE HCL IN DEXTROSE 360-4.14 MG/200ML-% IV SOLN
INTRAVENOUS | Status: AC
  Filled 2017-11-28: qty 200

## 2017-11-28 MED ORDER — ONDANSETRON HCL 4 MG/2ML IJ SOLN: 4.0000 mg | Freq: Four times a day (QID) | INTRAMUSCULAR | Status: DC | PRN

## 2017-11-28 MED ORDER — VANCOMYCIN HCL IN DEXTROSE 1-5 GM/200ML-% IV SOLN
1000.0000 mg | Freq: Once | INTRAVENOUS | Status: AC
  Administered 2017-11-28: 1000 mg via INTRAVENOUS
  Filled 2017-11-28: qty 200

## 2017-11-28 MED ORDER — METOPROLOL TARTRATE 5 MG/5ML IV SOLN
2.5000 mg | Freq: Once | INTRAVENOUS | Status: AC
  Administered 2017-11-28: 2.5 mg via INTRAVENOUS
  Filled 2017-11-28: qty 5

## 2017-11-28 MED ORDER — NETARSUDIL-LATANOPROST 0.02-0.005 % OP SOLN: 1.0000 [drp] | Freq: Every day | OPHTHALMIC | Status: DC

## 2017-11-28 MED ORDER — SODIUM CHLORIDE 0.9 % IV BOLUS (SEPSIS): 1000.0000 mL | Freq: Once | INTRAVENOUS | Status: DC

## 2017-11-28 MED ORDER — PANTOPRAZOLE SODIUM 40 MG PO TBEC: 40.0000 mg | DELAYED_RELEASE_TABLET | Freq: Every day | ORAL | Status: DC

## 2017-11-28 MED ORDER — SODIUM CHLORIDE 0.9 % IV SOLN
500.0000 mg | Freq: Once | INTRAVENOUS | Status: AC
  Administered 2017-11-28: 500 mg via INTRAVENOUS
  Filled 2017-11-28: qty 500

## 2017-11-28 MED ORDER — ACETAMINOPHEN 325 MG PO TABS: 650.0000 mg | ORAL_TABLET | Freq: Four times a day (QID) | ORAL | Status: DC | PRN

## 2017-11-28 MED ORDER — METOPROLOL TARTRATE 5 MG/5ML IV SOLN
5.0000 mg | Freq: Once | INTRAVENOUS | Status: AC
  Administered 2017-11-28: 5 mg via INTRAVENOUS
  Filled 2017-11-28: qty 5

## 2017-11-28 MED ORDER — PANTOPRAZOLE SODIUM 40 MG IV SOLR: 40.0000 mg | INTRAVENOUS | Status: DC

## 2017-11-28 MED ORDER — ONDANSETRON HCL 4 MG PO TABS: 4.0000 mg | ORAL_TABLET | Freq: Four times a day (QID) | ORAL | Status: DC | PRN

## 2017-11-28 MED ORDER — SODIUM CHLORIDE 0.9 % IV SOLN: INTRAVENOUS | Status: DC

## 2017-11-28 MED ORDER — ONDANSETRON HCL 4 MG/2ML IJ SOLN
4.0000 mg | Freq: Once | INTRAMUSCULAR | Status: AC
  Administered 2017-11-28: 4 mg via INTRAVENOUS
  Filled 2017-11-28: qty 2

## 2017-11-28 MED ORDER — VITAMIN B-12 1000 MCG PO TABS: 2000.0000 ug | ORAL_TABLET | Freq: Every day | ORAL | Status: DC

## 2017-11-28 MED ORDER — TRAZODONE HCL 50 MG PO TABS: 25.0000 mg | ORAL_TABLET | Freq: Every evening | ORAL | Status: DC | PRN

## 2017-11-28 MED ORDER — AMIODARONE IV BOLUS ONLY 150 MG/100ML
INTRAVENOUS | Status: AC
  Filled 2017-11-28: qty 100

## 2017-11-28 MED ORDER — MAGNESIUM SULFATE 2 GM/50ML IV SOLN
2.0000 g | Freq: Once | INTRAVENOUS | Status: AC
  Administered 2017-11-28: 2 g via INTRAVENOUS
  Filled 2017-11-28: qty 50

## 2017-11-28 MED ORDER — ASPIRIN EC 81 MG PO TBEC: 81.0000 mg | DELAYED_RELEASE_TABLET | Freq: Every day | ORAL | Status: DC

## 2017-11-28 MED ORDER — MOMETASONE FURO-FORMOTEROL FUM 200-5 MCG/ACT IN AERO
2.0000 | INHALATION_SPRAY | Freq: Two times a day (BID) | RESPIRATORY_TRACT | Status: DC
  Filled 2017-11-28: qty 8.8

## 2017-11-28 MED ORDER — ENOXAPARIN SODIUM 40 MG/0.4ML ~~LOC~~ SOLN: 40.0000 mg | SUBCUTANEOUS | Status: DC

## 2017-11-28 MED ORDER — SODIUM CHLORIDE 0.9 % IV SOLN: Freq: Once | INTRAVENOUS | Status: DC

## 2017-11-29 LAB — URINE CULTURE: Culture: NO GROWTH

## 2017-11-29 MED FILL — Medication: Qty: 1 | Status: AC

## 2017-11-30 LAB — CALCIUM, IONIZED: Calcium, Ionized, Serum: 3.9 mg/dL — ABNORMAL LOW (ref 4.5–5.6)

## 2017-12-01 NOTE — ED Triage Notes (Signed)
Pt presents via EMS c/o chest pain, N/V/D x1 day, and SOB this morning. RA saturation per EMS 78%. Presents on NRB mask with saturation 89%.

## 2017-12-01 NOTE — Progress Notes (Signed)
Patient ID: Ruben Flores, male   DOB: 12/29/1935, 82 y.o.   MRN: [id number] Code Blue was called Code was ran by Dr Corky Downs. Pt was intubated and on the ventilator. D/w dr Soyla Murphy about pt D/w son and wife.

## 2017-12-01 NOTE — Consult Note (Addendum)
Name: Ruben Flores MRN: [id number] DOB: 1935-09-26     CONSULTATION DATE: 12/20/17   HISTORY OF PRESENT ILLNESS:   82 years old gentleman with history of coronary artery disease, COPD, chronic respiratory failure on home O2 2 L/min nasal cannula, congestive heart failure HFP EF 50% on echo 02/2017, chronic anemia and orthostatic hypotension.  Patient presented to ED with shortness of breath for the last couple of days, while in the ED was found to have fever 101.8 with white cell count 15,000 and received empirically cefepime + Zithromax + vancomycin.  While in the ED he received 1.5 L fluid resuscitation for systolic blood pressure of 70 and he was admitted with viral syndrome. Upon arrival to the floor while the patient was moving his bowel he had a PEA cardiac arrest when he was found unresponsive with bluish discoloration of upper body received about 2 minutes of ACLS protocol. Upon arrival to the intensive care unit the patient was agonal and had second episode of cardiac arrest, cross after 2 minutes of ACLS.  Patient was started on amiodarone drip after receiving a bolus and received cocktail for hyperkalemia because of potassium and sodium bicarb was potassium 5.3 and metabolic acidosis bicarb is 18 and chemistry.  Episode of cardiac arrest while in intensive care unit last episode was witnessed by Dr. Clayborn Bigness cardiology was consulted for further management of possible acute coronary syndrome. Family arrived at the bedside and requested to continue with resuscitation at this point.  PAST MEDICAL HISTORY :   has a past medical history of Acute recurrent sinusitis, unspecified, Anemia, unspecified, B12 deficiency, CAD (coronary artery disease), Cancer ([place]) (2016), Cardiac resynchronization therapy pacemaker (CRT-P) in place (08/17/2016), Cardiomyopathy, secondary ([place]), CHF (congestive heart failure) ([place]), COPD (chronic obstructive pulmonary disease) ([place]), Depression, Diabetes mellitus  without complication ([place]), Duodenitis, History of elevated glucose, History of esophageal stricture, History of gastritis, History of gastroesophageal reflux (GERD), History of palpitations, Hyperlipidemia, unspecified, IBS (irritable bowel syndrome), Lipoma of back, Memory loss, NAION (non-arteritic anterior ischemic optic neuropathy), Perennial allergic rhinitis, Type 2 diabetes, diet controlled ([place]), and VBI (vertebrobasilar insufficiency).  has a past surgical history that includes Esophageal dilation (06/1990); Cardiac catheterization (05/1990); Cataract extraction, bilateral (2014); Lipoma excision (07/27/2008); Colonoscopy; Knee arthroscopy (Left, 05/06/2009); and Mohs surgery. Prior to Admission medications   Medication Sig Start Date End Date Taking? Authorizing Provider  acetaminophen (TYLENOL) 325 MG tablet Take 650 mg by mouth 4 (four) times daily.   Yes [provider]  albuterol (PROVENTIL HFA;VENTOLIN HFA) 108 (90 Base) MCG/ACT inhaler Inhale 2 puffs into the lungs every 6 (six) hours as needed for wheezing or shortness of breath.   Yes [provider]  aspirin EC 81 MG tablet Take 1 tablet by mouth daily. 06/20/17  Yes [provider]  brimonidine (ALPHAGAN) 0.15 % ophthalmic solution Place 1 drop into the left eye 2 (two) times daily.    Yes [provider]  Cholecalciferol 4000 units CAPS Take 1 capsule by mouth daily.   Yes [provider]  cyanocobalamin 1000 MCG tablet Take 2,000 mcg by mouth daily.   Yes [provider]  fluticasone (FLONASE) 50 MCG/ACT nasal spray Place 2 sprays into both nostrils daily.   Yes [provider]  fluticasone-salmeterol (ADVAIR HFA) 230-21 MCG/ACT inhaler Inhale 2 puffs into the lungs 2 (two) times daily.   Yes [provider]  furosemide (LASIX) 20 MG tablet Take 20 mg by mouth daily.    Yes [provider]  latanoprost (XALATAN) 0.005 % ophthalmic solution Place 1 drop  into both eyes at bedtime.    Yes [provider]  metFORMIN (GLUCOPHAGE) 500 MG tablet Take 1,000 mg by mouth daily.    Yes [provider]  metoprolol succinate (TOPROL-XL) 50 MG 24 hr tablet Take 50 mg by mouth daily. Take with or immediately following a meal.   Yes [provider]  omeprazole (PRILOSEC) 20 MG capsule Take 20 mg by mouth 2 (two) times daily before a meal.    Yes [provider]  OXYGEN Inhale 2 L into the lungs continuous.   Yes [provider]  potassium chloride (K-DUR) 10 MEQ tablet Take 2 tablets by mouth daily. 06/03/17  Yes [provider]  sertraline (ZOLOFT) 50 MG tablet Take 50 mg by mouth daily.    Yes [provider]  simvastatin (ZOCOR) 40 MG tablet Take 40 mg by mouth daily.   Yes [provider]  clonazePAM (KLONOPIN) 0.5 MG tablet Take 0.5 mg by mouth 2 (two) times daily as needed for anxiety.    [provider]  Lidocaine (ASPERCREME LIDOCAINE) 4 % PTCH Apply 1 patch topically daily. Apply to left hip area  Remove after 12 hours    [provider]  sennosides-docusate sodium (SENOKOT-S) 8.6-50 MG tablet Take 1 tablet by mouth 2 (two) times daily as needed for constipation.    [provider]  spironolactone (ALDACTONE) 25 MG tablet Take 12.5 mg by mouth daily.     [provider]   Allergies  Allergen Reactions  . Citalopram Diarrhea  . Fluvastatin     Other reaction(s): Other (See Comments) REFLUX  . Paroxetine Hcl     Other reaction(s): Unknown    FAMILY HISTORY:  family history includes Cancer in his sister; Diabetes in his brother; Heart attack in his father, paternal uncle, paternal uncle, paternal uncle, and sister; Heart disease in his brother and sister; Irregular heart beat in his maternal grandmother; Irritable bowel syndrome in his sister; Pancreatic cancer in his mother; Stroke in his maternal grandmother. SOCIAL HISTORY:  reports that he  quit smoking about 24 years ago. His smoking use included cigarettes. He has a 30.00 pack-year smoking history. His smokeless tobacco use includes chew. He reports that he does not drink alcohol.  REVIEW OF SYSTEMS:   Unable to obtain due to critical illness   VITAL SIGNS: Temp:  [98.3 F (36.8 C)-101.8 F (38.8 C)] 98.3 F (36.8 C) (11/28 1350) Pulse Rate:  [53-123] 88 (11/28 1350) Resp:  [13-25] 18 (11/28 1350) BP: (78-132)/(60-94) 95/63 (11/28 1350) SpO2:  [85 %-100 %] 97 % (11/28 1350) Weight:  [68.9 kg] 68.9 kg (11/28 1352)  Physical Examination:  Awake, lethargic, moving extremities and follows simple commands. Intubated, receiving a bag mask ventilation, anterior flail chest status post CPR, frothy hemorrhagic secretions via ET tube, bilateral equal air entry and diffuse medium crackles. S1 and S2 are audible, pacemaker rhythm and no murmur Benign abdominal exam with feeble peristalsis, Wasted extremities no peripheral edema Purple discoloration and mottling of the upper body which had resolved with ROSC  ASSESSMENT / PLAN:   Acute respiratory failure intubated during ACLS protocol.  History of chronic respiratory failure on home O2 2 L/min -monitor ABG and optimize ventilator settings  Cardiac arrest x2 was PEA as initial rhythm.  Total 5 minutes of ACLS protocol.  Cardiac history of coronary artery disease, LVEF of 50% on echo 02/2017.  Pacemaker rhythm.  Not candidate for hypothermia as the patient is awake moving extremities and follows simple commands. -Amiodarone + aspirin + epi drip as advised by cardiology Dr. Patrice Paradise -Monitor cardiac enzymes echocardiogram  Metabolic acidosis with questionable sepsis/metformin toxicity.  Lactic acid 2.87 -Monitor ABG, renal panel and lactic acid level. -Optimize sodium bicarb.  *Mild hyperkalemia.  Was possible overmedication with potassium supplement and spironolactone. *hyponatremia with intravascular volume depletion due to  diuresis with loop diuretics. -Optimize hydration/diuresis, monitor renal panel and urine output  Fever with no infiltrate on chest x-ray upon admission, procalcitonin 0.11 however patient presented with fever 101.  Possible viral syndrome -Continue with empiric cefepime, follows cultures, monitor procalcitonin and consider de-escalation of antimicrobials.  Diabetes mellitus -Glycemic control  Anemia -Keep hemoglobin more than 7 g/dL  Thrombocytopenia -Monitor platelet  Full code  DVT & GI prophylaxis.  Continue with supportive care  Critical care time 60 minutes   2 more episodes of cardiac arrest while in intensive care unit, witnessed by cardiology (Dr. Clayborn Bigness) at the bedside.  After he spoke with the family family decided to change CODE STATUS to DNR and patient expired.  Family was notified at the bedside

## 2017-12-01 NOTE — Progress Notes (Signed)
PT Cancellation Note  Patient Details Name: Ruben Flores MRN: [id number] DOB: 1935-07-05   Cancelled Treatment:    Reason Eval/Treat Not Completed: Medical issues which prohibited therapy(Consult received and chart reviewed. Patient now status post CODE BLUE with subsequent intubation.  Will complete orders at this time due to change in status and transfer to higher level of care.  Please re-consult as medically appropriate.)  Cyera Balboni H. Owens Shark, PT, DPT, NCS Dec 02, 2017, 2:52 PM [phone]

## 2017-12-01 NOTE — Progress Notes (Signed)
Family Meeting Note  Advance Directive: no  Today a meeting took place with the pt and son  Ruben Flores has history of coronary artery disease, chronic COPD on home oxygen, CHF systolic, diabetes comes to the emergency room with febrile illness and appears clinically dehydrated with being admitted for possible sepsis. Appears weak and deconditioned. Code status discussed with patient he wants to be a full code. wife and son in agreement. Time spent during discussion 16 mins Ruben Mandes, MD

## 2017-12-01 NOTE — ED Notes (Signed)
Pt transported to room 240  

## 2017-12-01 NOTE — Consult Note (Signed)
Called to see patient in ICU with recurrent episodes of CODE BLUE Patient appeared to be in PEA losing pulses has a pacemaker in place with makes it difficult to assess Patient intubated sedated History presented with chest pain symptoms of the known history of cardiomyopathy CRT with improved LV function of around 50%. With multiple episodes of CODE BLUE when cardiac compressions no defibrillation the patient family eventually decided not to proceed and to withdraw care Patient was subsequently pronounced by ICU team

## 2017-12-01 NOTE — H&P (Addendum)
[facility] [facility] NAME: Ruben Flores    MR#:  [id number]  DATE OF BIRTH:  April 15, 1935  DATE OF ADMISSION:  12-04-2017  PRIMARY CARE PHYSICIAN: Derinda Late, MD   REQUESTING/REFERRING PHYSICIAN: Dr Quentin Cornwall  CHIEF COMPLAINT:  feeling weak, vomiting, fever of 101.8 today.  HISTORY OF PRESENT ILLNESS:  Ruben Flores  is a 82 y.o. male with a known history of CAD, chronic COPD on home oxygen, CHF systolic with EF of 74% most recent echo February 2019, chronic anemia, history of orthostatic hypotension comes to the emergency room with feeling weak which started about two days ago and mild shortness of breath. In the ER patient was found to have temperature 101.8. He was found to white count of 15,000. Started on sepsis workup. So far chest x-rays negative. UA negative for UTI. He received broad-spectrum IV antibiotics. Pro calcitonin 0.11.  he did have an episode of vomiting this morning. No diarrhea. Blood pressure down into the 70s. Received total of about 1.5 L of fluid in the ER.  Pt's son gives me most of the history along with wife is present in the ER.  Pt  is being admitted with sepsis. So far no source identified. Could be viral syndrome as well.  PAST MEDICAL HISTORY:   Past Medical History:  Diagnosis Date  . Acute recurrent sinusitis, unspecified   . Anemia, unspecified   . B12 deficiency    diagnosed in 02/12/2008  . CAD (coronary artery disease)    atherosclerotic coronary artery disease  . Cancer ([place]) 2016   skin  . Cardiac resynchronization therapy pacemaker (CRT-P) in place 08/17/2016  . Cardiomyopathy, secondary ([place])   . CHF (congestive heart failure) ([place])   . COPD (chronic obstructive pulmonary disease) ([place])   . Depression    unspecifed  . Diabetes mellitus without complication ([place])   . Duodenitis   . History of elevated glucose   . History of esophageal stricture   . History of gastritis   .  History of gastroesophageal reflux (GERD)   . History of palpitations    chronic  . Hyperlipidemia, unspecified   . IBS (irritable bowel syndrome)   . Lipoma of back   . Memory loss    ? due to B12 deficiency. Eval Dr. Bridgett Larsson  . NAION (non-arteritic anterior ischemic optic neuropathy)    NAION left eye dx'd 4/14 by Dr. Talbert Forest felt secondary to orthostatic hypotension with hypoperfusion.  Marland Kitchen Perennial allergic rhinitis       . Type 2 diabetes, diet controlled ([place])   . VBI (vertebrobasilar insufficiency)    VBI with acute vertigo 9/10. MRI brain unremarkable    PAST SURGICAL HISTOIRY:   Past Surgical History:  Procedure Laterality Date  . CARDIAC CATHETERIZATION  05/1990   12/1995  . CATARACT EXTRACTION, BILATERAL  2014  . COLONOSCOPY    . ESOPHAGEAL DILATION  06/1990   Dr. Vira Agar by EGD for esophageal stricture  . KNEE ARTHROSCOPY Left 05/06/2009   Left knee arthroscopy, partial medial and lateral meniscectomies, chondroplasty of the lateral femoral condyle and patellofemoral articulation  . LIPOMA EXCISION  07/27/2008   from back  . MOHS SURGERY     skin cancer excision    SOCIAL HISTORY:   Social History   Tobacco Use  . Smoking status: Former Smoker    Packs/day: 1.00    Years: 30.00    Pack years: 30.00    Types: Cigarettes  Last attempt to quit: 01/01/1993    Years since quitting: 24.9  . Smokeless tobacco: Current User    Types: Chew  . Tobacco comment: quit 20 years ago  Substance Use Topics  . Alcohol use: No    Frequency: Never    FAMILY HISTORY:   Family History  Problem Relation Age of Onset  . Pancreatic cancer Mother   . Heart attack Father   . Heart attack Sister        x2  . Heart disease Sister   . Diabetes Brother   . Heart disease Brother   . Stroke Maternal Grandmother   . Irregular heart beat Maternal Grandmother   . Heart attack Paternal Uncle   . Heart attack Paternal Uncle   . Heart attack Paternal Uncle   . Irritable bowel  syndrome Sister   . Cancer Sister   . Anesthesia problems Neg Hx     DRUG ALLERGIES:   Allergies  Allergen Reactions  . Citalopram Diarrhea  . Fluvastatin     Other reaction(s): Other (See Comments) REFLUX  . Paroxetine Hcl     Other reaction(s): Unknown    REVIEW OF SYSTEMS:  Review of Systems  Constitutional: Positive for fever and malaise/fatigue. Negative for chills and weight loss.  HENT: Negative for ear discharge, ear pain and nosebleeds.   Eyes: Negative for blurred vision, pain and discharge.  Respiratory: Positive for shortness of breath. Negative for sputum production, wheezing and stridor.   Cardiovascular: Positive for chest pain. Negative for palpitations, orthopnea and PND.  Gastrointestinal: Negative for abdominal pain, diarrhea, nausea and vomiting.  Genitourinary: Negative for frequency and urgency.  Musculoskeletal: Positive for joint pain. Negative for back pain.  Neurological: Positive for weakness. Negative for sensory change, speech change and focal weakness.  Psychiatric/Behavioral: Negative for depression and hallucinations. The patient is not nervous/anxious.      MEDICATIONS AT HOME:   Prior to Admission medications   Medication Sig Start Date End Date Taking? Authorizing Provider  acetaminophen (TYLENOL) 325 MG tablet Take 650 mg by mouth 4 (four) times daily.   Yes [provider]  albuterol (PROVENTIL HFA;VENTOLIN HFA) 108 (90 Base) MCG/ACT inhaler Inhale 2 puffs into the lungs every 6 (six) hours as needed for wheezing or shortness of breath.   Yes [provider]  aspirin EC 81 MG tablet Take 1 tablet by mouth daily. 06/20/17  Yes [provider]  Cholecalciferol 4000 units CAPS Take 1 capsule by mouth daily.   Yes [provider]  cyanocobalamin 1000 MCG tablet Take 2,000 mcg by mouth daily.   Yes [provider]  fluticasone (FLONASE) 50 MCG/ACT nasal spray Place 2 sprays into both nostrils daily.    Yes [provider]  fluticasone-salmeterol (ADVAIR HFA) 230-21 MCG/ACT inhaler Inhale 2 puffs into the lungs 2 (two) times daily.   Yes [provider]  furosemide (LASIX) 20 MG tablet Take 20 mg by mouth daily.    Yes [provider]  metFORMIN (GLUCOPHAGE) 500 MG tablet Take 1,000 mg by mouth daily.    Yes [provider]  metoprolol succinate (TOPROL-XL) 50 MG 24 hr tablet Take 50 mg by mouth daily. Take with or immediately following a meal.   Yes [provider]  Netarsudil-Latanoprost 0.02-0.005 % SOLN Apply 1 drop to eye at bedtime. 06/17/17  Yes [provider]  omeprazole (PRILOSEC) 20 MG capsule Take 20 mg by mouth 2 (two) times daily before a meal.  Yes [provider]  OXYGEN Inhale 2 L into the lungs continuous.   Yes [provider]  potassium chloride (K-DUR) 10 MEQ tablet Take 2 tablets by mouth daily. 06/03/17  Yes [provider]  sertraline (ZOLOFT) 50 MG tablet Take 50 mg by mouth daily.    Yes [provider]  simvastatin (ZOCOR) 40 MG tablet Take 40 mg by mouth daily.   Yes [provider]  clonazePAM (KLONOPIN) 0.5 MG tablet Take 0.5 mg by mouth 2 (two) times daily as needed for anxiety.    [provider]  Lidocaine (ASPERCREME LIDOCAINE) 4 % PTCH Apply 1 patch topically daily. Apply to left hip area  Remove after 12 hours    [provider]  midodrine (PROAMATINE) 10 MG tablet Take 10 mg by mouth 3 (three) times daily.    [provider]  sennosides-docusate sodium (SENOKOT-S) 8.6-50 MG tablet Take 1 tablet by mouth 2 (two) times daily as needed for constipation.    [provider]  spironolactone (ALDACTONE) 25 MG tablet Take 12.5 mg by mouth daily.     [provider]      VITAL SIGNS:  Blood pressure 93/69, pulse 92, temperature (!) 101.8 F (38.8 C), temperature source Rectal, resp. rate 20, weight 68.9 kg, SpO2 100  %.  PHYSICAL EXAMINATION:  GENERAL:  82 y.o.-year-old patient lying in the bed with no acute distress. Thin fraile EYES: Pupils equal, round, reactive to light and accommodation. No scleral icterus. Extraocular muscles intact.  HEENT: Head atraumatic, normocephalic. Oropharynx and nasopharynx clear.  NECK:  Supple, no jugular venous distention. No thyroid enlargement, no tenderness.  LUNGS: decreased breath sounds bilaterally, no wheezing, few bibasolar rales,rhonchi or crepitation. No use of accessory muscles of respiration.  CARDIOVASCULAR: S1, S2 normal. No murmurs, rubs, or gallops.  ABDOMEN: Soft, nontender, nondistended. Bowel sounds present. No organomegaly or mass.  EXTREMITIES: No pedal edema, cyanosis, or clubbing.  NEUROLOGIC: Cranial nerves II through XII are intact. Muscle strength 4/5 in all extremities. Sensation intact. Gait not checked. deconditioned PSYCHIATRIC: The patient is alert and oriented x 3.  SKIN: No obvious rash, lesion, or ulcer.   LABORATORY PANEL:   CBC Recent Labs  Lab 12-20-17 0932  WBC 15.9*  HGB 9.9*  HCT 30.7*  PLT 137*   ------------------------------------------------------------------------------------------------------------------  Chemistries  Recent Labs  Lab 2017-12-20 0932  NA 121*  K 5.3*  CL 91*  CO2 17*  GLUCOSE 243*  BUN 16  CREATININE 1.15  CALCIUM 8.9  AST 29  ALT 19  ALKPHOS 52  BILITOT 1.5*   ------------------------------------------------------------------------------------------------------------------  Cardiac Enzymes Recent Labs  Lab Dec 20, 2017 0932  TROPONINI 0.03*   ------------------------------------------------------------------------------------------------------------------  RADIOLOGY:  Dg Chest Port 1 View  Result Date: 2017/12/20 CLINICAL DATA:  Shortness of breath and chest pain EXAM: PORTABLE CHEST 1 VIEW COMPARISON:  10/26/2017 FINDINGS: Cardiac shadow is enlarged. Pacing device is again noted  and stable. The lungs are well aerated bilaterally. No focal infiltrate or sizable effusion is seen. No acute bony abnormality is noted. IMPRESSION: No acute abnormality seen. Electronically Signed   By: Inez Catalina M.D.   On: 12/20/17 10:07    EKG:    IMPRESSION AND PLAN:   Ruben Flores  is a 82 y.o. male with a known history of CAD, chronic COPD on home oxygen, CHF systolic with EF of 63% most recent echo February 2019, chronic anemia, history of orthostatic hypotension comes to the emergency room with feeling weak which started  about two days ago and mild shortness of breath.  1. febrile illness/sepsis--- unclear etiology at present, suspected viral syndrome -patient presented with not feeling well for two days, week fatigued and fever of 101.8 with episode of vomiting and some sob -received IV broad-spectrum antibiotics in the ER. Chest x-ray negative for pneumonia. UA negative for UTI, Pro calcitonin 0.11 -I will hold off antibiotics for now., Monitor fever curve. -This was discussed with patient's son who is agreeable.  2. relative hypotension with  Hyponatremia and SOB -received about 1.5 L in the ER. Will give maintenance fluid at low rate. Patient has history of CHF -hold blood pressure meds. -check BNP -Patient does not take midodrine at home. He has history of orthostatic hypotension how were taken off midodrine by cardiology Dr. Saralyn Pilar according to the son -if BNP elevated then consider low dose lasix if BP holds up  3. COPD/chronic respiratory failure on home oxygen follows with Dr. Raul Del - cont nebulizer and  home inhalers  4. Diabetes sliding scale insulin and home meds  5. Generalized weakness deconditioning and risk for fall -PT to see patient. -Patient has been ambulating at home using a walker according to the son  Discussed with patient's son and wife in the room  All the records are reviewed and case discussed with ED provider. Management plans  discussed with the patient, family and they are in agreement.  CODE STATUS: full  TOTAL TIME TAKING CARE OF THIS PATIENT: 50 minutes.    Fritzi Mandes M.D on 12/06/17 at 12:26 PM  Between 7am to 6pm - Pager - [phone]  After 6pm go to [email or web address] - password [email or web address] [facility]  [facility]  Office  [phone]  CC: Primary care physician; Derinda Late, MD

## 2017-12-01 NOTE — ED Provider Notes (Signed)
[facility] Emergency Department Provider Note    None    (approximate)  I have reviewed the triage vital signs and the nursing notes.   HISTORY  Chief Complaint Shortness of Breath    HPI Ruben Flores is a 82 y.o. male presents the ER with a past medical history as listed below with nausea vomiting diarrhea worsening shortness of breath and chest discomfort started this morning.  Patient found by EMS found to be hypoxic requiring nonrebreather as well as febrile and tachycardic.  Patient was able to keep any of his medications down this morning secondary to nausea.  Denies any abdominal pain.  No worsening lower extremity swelling.    Past Medical History:  Diagnosis Date  . Acute recurrent sinusitis, unspecified   . Anemia, unspecified   . B12 deficiency    diagnosed in 02/12/2008  . CAD (coronary artery disease)    atherosclerotic coronary artery disease  . Cancer ([place]) 2016   skin  . Cardiac resynchronization therapy pacemaker (CRT-P) in place 08/17/2016  . Cardiomyopathy, secondary ([place])   . CHF (congestive heart failure) ([place])   . COPD (chronic obstructive pulmonary disease) ([place])   . Depression    unspecifed  . Diabetes mellitus without complication ([place])   . Duodenitis   . History of elevated glucose   . History of esophageal stricture   . History of gastritis   . History of gastroesophageal reflux (GERD)   . History of palpitations    chronic  . Hyperlipidemia, unspecified   . IBS (irritable bowel syndrome)   . Lipoma of back   . Memory loss    ? due to B12 deficiency. Eval Dr. Bridgett Larsson  . NAION (non-arteritic anterior ischemic optic neuropathy)    NAION left eye dx'd 4/14 by Dr. Talbert Forest felt secondary to orthostatic hypotension with hypoperfusion.  Marland Kitchen Perennial allergic rhinitis       . Type 2 diabetes, diet controlled ([place])   . VBI (vertebrobasilar insufficiency)    VBI with acute vertigo 9/10. MRI brain unremarkable   Family  History  Problem Relation Age of Onset  . Pancreatic cancer Mother   . Heart attack Father   . Heart attack Sister        x2  . Heart disease Sister   . Diabetes Brother   . Heart disease Brother   . Stroke Maternal Grandmother   . Irregular heart beat Maternal Grandmother   . Heart attack Paternal Uncle   . Heart attack Paternal Uncle   . Heart attack Paternal Uncle   . Irritable bowel syndrome Sister   . Cancer Sister   . Anesthesia problems Neg Hx    Past Surgical History:  Procedure Laterality Date  . CARDIAC CATHETERIZATION  05/1990   12/1995  . CATARACT EXTRACTION, BILATERAL  2014  . COLONOSCOPY    . ESOPHAGEAL DILATION  06/1990   Dr. Vira Agar by EGD for esophageal stricture  . KNEE ARTHROSCOPY Left 05/06/2009   Left knee arthroscopy, partial medial and lateral meniscectomies, chondroplasty of the lateral femoral condyle and patellofemoral articulation  . LIPOMA EXCISION  07/27/2008   from back  . MOHS SURGERY     skin cancer excision   Patient Active Problem List   Diagnosis Date Noted  . Closed transverse fracture of acetabulum ([place]) 01/20/2017  . Weakness 01/20/2017  . Hip fracture ([place]) 12/10/2016      Prior to Admission medications   Medication Sig Start Date End Date Taking?  Authorizing Provider  acetaminophen (TYLENOL) 325 MG tablet Take 650 mg by mouth 4 (four) times daily.   Yes [provider]  albuterol (PROVENTIL HFA;VENTOLIN HFA) 108 (90 Base) MCG/ACT inhaler Inhale 2 puffs into the lungs every 6 (six) hours as needed for wheezing or shortness of breath.   Yes [provider]  aspirin EC 81 MG tablet Take 1 tablet by mouth daily. 06/20/17  Yes [provider]  Cholecalciferol 4000 units CAPS Take 1 capsule by mouth daily.   Yes [provider]  cyanocobalamin 1000 MCG tablet Take 2,000 mcg by mouth daily.   Yes [provider]  fluticasone (FLONASE) 50 MCG/ACT nasal spray Place 2 sprays into both nostrils  daily.   Yes [provider]  fluticasone-salmeterol (ADVAIR HFA) 230-21 MCG/ACT inhaler Inhale 2 puffs into the lungs 2 (two) times daily.   Yes [provider]  furosemide (LASIX) 20 MG tablet Take 20 mg by mouth daily.    Yes [provider]  latanoprost (XALATAN) 0.005 % ophthalmic solution Place 1 drop into both eyes at bedtime.   Yes [provider]  metFORMIN (GLUCOPHAGE) 500 MG tablet Take 1,000 mg by mouth daily.    Yes [provider]  metoprolol succinate (TOPROL-XL) 50 MG 24 hr tablet Take 50 mg by mouth daily. Take with or immediately following a meal.   Yes [provider]  Netarsudil-Latanoprost 0.02-0.005 % SOLN Apply 1 drop to eye at bedtime. 06/17/17  Yes [provider]  omeprazole (PRILOSEC) 20 MG capsule Take 20 mg by mouth 2 (two) times daily before a meal.    Yes [provider]  OXYGEN Inhale 2 L into the lungs continuous.   Yes [provider]  potassium chloride (K-DUR) 10 MEQ tablet Take 2 tablets by mouth daily. 06/03/17  Yes [provider]  sertraline (ZOLOFT) 50 MG tablet Take 50 mg by mouth daily.    Yes [provider]  simvastatin (ZOCOR) 40 MG tablet Take 40 mg by mouth daily.   Yes [provider]  clonazePAM (KLONOPIN) 0.5 MG tablet Take 0.5 mg by mouth 2 (two) times daily as needed for anxiety.    [provider]  Lidocaine (ASPERCREME LIDOCAINE) 4 % PTCH Apply 1 patch topically daily. Apply to left hip area  Remove after 12 hours    [provider]  midodrine (PROAMATINE) 10 MG tablet Take 10 mg by mouth 3 (three) times daily.    [provider]  oxyCODONE (OXY IR/ROXICODONE) 5 MG immediate release tablet Take 1 tablet (5 mg total) by mouth every 4 (four) hours as needed for moderate pain. Patient not taking: Reported on 12/04/2017 12/17/16   Toni Arthurs, NP  sennosides-docusate sodium (SENOKOT-S) 8.6-50 MG tablet Take 1 tablet  by mouth 2 (two) times daily as needed for constipation.    [provider]  spironolactone (ALDACTONE) 25 MG tablet Take 12.5 mg by mouth daily.     [provider]  traMADol (ULTRAM) 50 MG tablet Take 1 tablet (50 mg total) by mouth every 4 (four) hours as needed. Patient not taking: Reported on 12-04-2017 12/17/16   Toni Arthurs, NP    Allergies Citalopram; Fluvastatin; and Paroxetine hcl    Social History Social History   Tobacco Use  . Smoking status: Former Smoker    Packs/day: 1.00    Years: 30.00    Pack years: 30.00    Types: Cigarettes    Last attempt to quit: 01/01/1993  Years since quitting: 24.9  . Smokeless tobacco: Current User    Types: Chew  . Tobacco comment: quit 20 years ago  Substance Use Topics  . Alcohol use: No    Frequency: Never  . Drug use: Not on file    Review of Systems Patient denies headaches, rhinorrhea, blurry vision, numbness, shortness of breath, chest pain, edema, cough, abdominal pain, nausea, vomiting, diarrhea, dysuria, fevers, rashes or hallucinations unless otherwise stated above in HPI. ____________________________________________   PHYSICAL EXAM:  VITAL SIGNS: Vitals:   12-17-2017 1015 12/17/2017 1030  BP:  (!) 122/94  Pulse:    Resp:  19  Temp:    SpO2: 100%     Constitutional: Alert but critically ill appearing in moderate resp distress Eyes: Conjunctivae are normal.  Head: Atraumatic. Nose: No congestion/rhinnorhea. Mouth/Throat: Mucous membranes are moist.   Neck: No stridor. Painless ROM.  Cardiovascular: tachycardic rate, regular rhythm. Grossly normal heart sounds.  Good peripheral circulation. Respiratory: tachypnea with diminished bibasilar breathsounds, no stridor Gastrointestinal: Soft and nontender. No distention. No abdominal bruits. No CVA tenderness. Genitourinary: deferred Musculoskeletal: No lower extremity tenderness nor edema.  No joint effusions. Neurologic:   No gross focal  neurologic deficits are appreciated. No facial droop Skin:  Skin is warm, dry and intact. No rash noted. Psychiatric: Mood and affect are normal. Speech and behavior are normal.  ____________________________________________   LABS (all labs ordered are listed, but only abnormal results are displayed)  Results for orders placed or performed during the hospital encounter of 12/17/2017 (from the past 24 hour(s))  CG4 I-STAT (Lactic acid)     Status: Abnormal   Collection Time: 12/17/17  9:31 AM  Result Value Ref Range   Lactic Acid, Venous 2.87 (HH) 0.5 - 1.9 mmol/L   Comment NOTIFIED PHYSICIAN   Comprehensive metabolic panel     Status: Abnormal   Collection Time: 17-Dec-2017  9:32 AM  Result Value Ref Range   Sodium 121 (L) 135 - 145 mmol/L   Potassium 5.3 (H) 3.5 - 5.1 mmol/L   Chloride 91 (L) 98 - 111 mmol/L   CO2 17 (L) 22 - 32 mmol/L   Glucose, Bld 243 (H) 70 - 99 mg/dL   BUN 16 8 - 23 mg/dL   Creatinine, Ser 1.15 0.61 - 1.24 mg/dL   Calcium 8.9 8.9 - 10.3 mg/dL   Total Protein 6.6 6.5 - 8.1 g/dL   Albumin 3.6 3.5 - 5.0 g/dL   AST 29 15 - 41 U/L   ALT 19 0 - 44 U/L   Alkaline Phosphatase 52 38 - 126 U/L   Total Bilirubin 1.5 (H) 0.3 - 1.2 mg/dL   GFR calc non Af Amer >60 >60 mL/min   GFR calc Af Amer >60 >60 mL/min   Anion gap 13 5 - 15  Troponin I - ONCE - STAT     Status: Abnormal   Collection Time: 12/17/2017  9:32 AM  Result Value Ref Range   Troponin I 0.03 (HH) <0.03 ng/mL  CBC WITH DIFFERENTIAL     Status: Abnormal   Collection Time: 12-17-17  9:32 AM  Result Value Ref Range   WBC 15.9 (H) 4.0 - 10.5 K/uL   RBC 3.54 (L) 4.22 - 5.81 MIL/uL   Hemoglobin 9.9 (L) 13.0 - 17.0 g/dL   HCT 30.7 (L) 39.0 - 52.0 %   MCV 86.7 80.0 - 100.0 fL   MCH 28.0 26.0 - 34.0 pg   MCHC 32.2 30.0 - 36.0 g/dL  RDW 14.6 11.5 - 15.5 %   Platelets 137 (L) 150 - 400 K/uL   nRBC 0.1 0.0 - 0.2 %   Neutrophils Relative % 88 %   Neutro Abs 14.0 (H) 1.7 - 7.7 K/uL   Lymphocytes Relative 3 %     Lymphs Abs 0.4 (L) 0.7 - 4.0 K/uL   Monocytes Relative 8 %   Monocytes Absolute 1.3 (H) 0.1 - 1.0 K/uL   Eosinophils Relative 0 %   Eosinophils Absolute 0.0 0.0 - 0.5 K/uL   Basophils Relative 0 %   Basophils Absolute 0.0 0.0 - 0.1 K/uL   Immature Granulocytes 1 %   Abs Immature Granulocytes 0.17 (H) 0.00 - 0.07 K/uL  Procalcitonin     Status: None   Collection Time: 12/21/17  9:32 AM  Result Value Ref Range   Procalcitonin 0.11 ng/mL  Urinalysis, Complete w Microscopic     Status: Abnormal   Collection Time: Dec 21, 2017  9:32 AM  Result Value Ref Range   Color, Urine YELLOW (A) YELLOW   APPearance CLEAR (A) CLEAR   Specific Gravity, Urine 1.018 1.005 - 1.030   pH 5.0 5.0 - 8.0   Glucose, UA 50 (A) NEGATIVE mg/dL   Hgb urine dipstick MODERATE (A) NEGATIVE   Bilirubin Urine NEGATIVE NEGATIVE   Ketones, ur NEGATIVE NEGATIVE mg/dL   Protein, ur NEGATIVE NEGATIVE mg/dL   Nitrite NEGATIVE NEGATIVE   Leukocytes, UA NEGATIVE NEGATIVE   RBC / HPF 6-10 0 - 5 RBC/hpf   WBC, UA 0-5 0 - 5 WBC/hpf   Bacteria, UA NONE SEEN NONE SEEN   Squamous Epithelial / LPF 0-5 0 - 5   Mucus PRESENT    Hyaline Casts, UA PRESENT   Influenza panel by PCR (type A & B)     Status: None   Collection Time: 12-21-17  9:32 AM  Result Value Ref Range   Influenza A By PCR NEGATIVE NEGATIVE   Influenza B By PCR NEGATIVE NEGATIVE   ____________________________________________  EKG My review and personal interpretation at Time: 9:25   Indication: resp distress  Rate: 105  Rhythm: a-s v-p Axis: left Other: abnormal ekg ____________________________________________  RADIOLOGY  I personally reviewed all radiographic images ordered to evaluate for the above acute complaints and reviewed radiology reports and findings.  These findings were personally discussed with the patient.  Please see medical record for radiology report.  ____________________________________________   PROCEDURES  Procedure(s)  performed:  .Critical Care Performed by: Merlyn Lot, MD Authorized by: Merlyn Lot, MD   Critical care provider statement:    Critical care time (minutes):  30   Critical care time was exclusive of:  Separately billable procedures and treating other patients   Critical care was necessary to treat or prevent imminent or life-threatening deterioration of the following conditions:  Sepsis   Critical care was time spent personally by me on the following activities:  Development of treatment plan with patient or surrogate, discussions with consultants, evaluation of patient's response to treatment, examination of patient, obtaining history from patient or surrogate, ordering and performing treatments and interventions, ordering and review of laboratory studies, ordering and review of radiographic studies, pulse oximetry, re-evaluation of patient's condition and review of old charts      Critical Care performed: yes ____________________________________________   INITIAL IMPRESSION / [place] / ED COURSE  Pertinent labs & imaging results that were available during my care of the patient were reviewed by me and considered in my medical decision  making (see chart for details).   DDX:pna, uti, chf, copd, anemia, viral illness  Ruben Flores is a 82 y.o. who presents to the ED with symptoms as described above.  Patient febrile tachycardic.  Will start septic work-up.  Will give nebulizers.  The patient will be placed on continuous pulse oximetry and telemetry for monitoring.  Laboratory evaluation will be sent to evaluate for the above complaints.     Clinical Course as of Nov 28 1120  Thu Dec 03, 2017 On review of patient's past medical history does have significant cardiomyopathy with EF of 20%.  His blood pressure is 117/70 after small bolus of IV fluid therefore will hold off on full 30 cc/kg resuscitation at this time.  We will get slow and frequent aliquots of  resuscitation after clinical reassessment.   [PR]  1042 Patient with significant dehydration.  Obvious hyponatremia metabolic acidosis.  No evidence of UTI.  No clear evidence of pneumonia.  His abdominal exam is soft and benign.  Possible bacteremia as the patient is immune compromised with underlying diabetes.  No evidence of wound.  Still significantly tachycardic in the 130s.  Given dose of Lopressor with improvement.  Will give his oral meds and reassess.   [PR]  1102 Procalcitonin is less than 0.11 given absence of source likely viral enteritis.  Possible neurovirus is that seems to be going around the community.  He is responding to Lopressor.  Blood pressure stabilizing.  Will give additional IV hydration doses of beta-blocker and reinitiate his home medications.   [PR]    Clinical Course User Index [PR] Merlyn Lot, MD     As part of my medical decision making, I reviewed the following data within the [facility] notes reviewed and incorporated, Labs reviewed, notes from prior ED visits.   ____________________________________________   FINAL CLINICAL IMPRESSION(S) / ED DIAGNOSES  Final diagnoses:  Sepsis, due to unspecified organism, unspecified whether acute organ dysfunction present ([place])  Non-intractable vomiting with nausea, unspecified vomiting type  Tachycardia      NEW MEDICATIONS STARTED DURING THIS VISIT:  New Prescriptions   No medications on file     Note:  This document was prepared using Dragon voice recognition software and may include unintentional dictation errors.    Merlyn Lot, MD Dec 03, 2017 1122

## 2017-12-01 NOTE — Progress Notes (Signed)
CODE SEPSIS - PHARMACY COMMUNICATION  **Broad Spectrum Antibiotics should be administered within 1 hour of Sepsis diagnosis**  Time Code Sepsis Called/Page Received: @ 8721  Antibiotics Ordered: Cefepime 2g and Vancomycin 1g  Time of 1st antibiotic administration: @ [phone]  Additional action taken by pharmacy:  Spoke with nurse @ [phone] about Abx orders and time left to start Abx.   If necessary, Name of Provider/Nurse Contacted: [place], RN  Pernell Dupre, PharmD, BCPS Clinical Pharmacist December 13, 2017 9:56 AM

## 2017-12-01 NOTE — ED Provider Notes (Signed)
[facility] Department of Emergency Medicine   Code Blue CONSULT NOTE  Chief Complaint: Cardiac arrest/unresponsive   Level V Caveat: Unresponsive  History of present illness: I was contacted by the hospital for a CODE BLUE cardiac arrest upstairs and presented to the patient's bedside.  Patient reportedly recently admitted for sepsis, apparently had bowel movement then seizure-like activity then cardiac arrest.  ROS: Unable to obtain, Level V caveat  Scheduled Meds: . acetaminophen  650 mg Oral QID  . aspirin EC  81 mg Oral Daily  . enoxaparin (LOVENOX) injection  40 mg Subcutaneous Q24H  . fluticasone  2 spray Each Nare Daily  . metoprolol succinate  50 mg Oral Daily  . mometasone-formoterol  2 puff Inhalation BID  . Netarsudil-Latanoprost  1 drop Ophthalmic QHS  . pantoprazole  40 mg Oral Daily  . sertraline  50 mg Oral Daily  . simvastatin  40 mg Oral Daily  . cyanocobalamin  2,000 mcg Oral Daily   Continuous Infusions: . sodium chloride Stopped (12-22-2017 1056)  . sodium chloride     PRN Meds:.acetaminophen **OR** acetaminophen, albuterol, fentaNYL (SUBLIMAZE) injection, ondansetron **OR** ondansetron (ZOFRAN) IV, traZODone Past Medical History:  Diagnosis Date  . Acute recurrent sinusitis, unspecified   . Anemia, unspecified   . B12 deficiency    diagnosed in 02/12/2008  . CAD (coronary artery disease)    atherosclerotic coronary artery disease  . Cancer ([place]) 2016   skin  . Cardiac resynchronization therapy pacemaker (CRT-P) in place 08/17/2016  . Cardiomyopathy, secondary ([place])   . CHF (congestive heart failure) ([place])   . COPD (chronic obstructive pulmonary disease) ([place])   . Depression    unspecifed  . Diabetes mellitus without complication ([place])   . Duodenitis   . History of elevated glucose   . History of esophageal stricture   . History of gastritis   . History of gastroesophageal reflux (GERD)   . History of palpitations     chronic  . Hyperlipidemia, unspecified   . IBS (irritable bowel syndrome)   . Lipoma of back   . Memory loss    ? due to B12 deficiency. Eval Dr. Bridgett Larsson  . NAION (non-arteritic anterior ischemic optic neuropathy)    NAION left eye dx'd 4/14 by Dr. Talbert Forest felt secondary to orthostatic hypotension with hypoperfusion.  Marland Kitchen Perennial allergic rhinitis       . Type 2 diabetes, diet controlled ([place])   . VBI (vertebrobasilar insufficiency)    VBI with acute vertigo 9/10. MRI brain unremarkable   Past Surgical History:  Procedure Laterality Date  . CARDIAC CATHETERIZATION  05/1990   12/1995  . CATARACT EXTRACTION, BILATERAL  2014  . COLONOSCOPY    . ESOPHAGEAL DILATION  06/1990   Dr. Vira Agar by EGD for esophageal stricture  . KNEE ARTHROSCOPY Left 05/06/2009   Left knee arthroscopy, partial medial and lateral meniscectomies, chondroplasty of the lateral femoral condyle and patellofemoral articulation  . LIPOMA EXCISION  07/27/2008   from back  . MOHS SURGERY     skin cancer excision   Social History   Socioeconomic History  . Marital status: Married    Spouse name: Not on file  . Number of children: 2  . Years of education: 75  . Highest education level: High school graduate  Occupational History  . Not on file  Social Needs  . Financial resource strain: Not on file  . Food insecurity:    Worry: Not on file    Inability:  Not on file  . Transportation needs:    Medical: Not on file    Non-medical: Not on file  Tobacco Use  . Smoking status: Former Smoker    Packs/day: 1.00    Years: 30.00    Pack years: 30.00    Types: Cigarettes    Last attempt to quit: 01/01/1993    Years since quitting: 24.9  . Smokeless tobacco: Current User    Types: Chew  . Tobacco comment: quit 20 years ago  Substance and Sexual Activity  . Alcohol use: No    Frequency: Never  . Drug use: Not on file  . Sexual activity: Not on file  Lifestyle  . Physical activity:    Days per week: Not on file     Minutes per session: Not on file  . Stress: Not on file  Relationships  . Social connections:    Talks on phone: Not on file    Gets together: Not on file    Attends religious service: Not on file    Active member of club or organization: Not on file    Attends meetings of clubs or organizations: Not on file    Relationship status: Not on file  . Intimate partner violence:    Fear of current or ex partner: Not on file    Emotionally abused: Not on file    Physically abused: Not on file    Forced sexual activity: Not on file  Other Topics Concern  . Not on file  Social History Narrative   Full code   Former smoker   Alcohol use : none   Married   2 children   Allergies  Allergen Reactions  . Citalopram Diarrhea  . Fluvastatin     Other reaction(s): Other (See Comments) REFLUX  . Paroxetine Hcl     Other reaction(s): Unknown    Last set of Vital Signs (not current) Vitals:   12/02/17 1300 Dec 02, 2017 1350  BP: 108/74 95/63  Pulse: 88 88  Resp: 13 18  Temp:  98.3 F (36.8 C)  SpO2: 100% 97%      Physical Exam  Gen: unresponsive Cardiovascular: pulseless  Resp: apneic. Breath sounds equal bilaterally with bagging  Abd: nondistended  Neuro: GCS 3, unresponsive to pain  HEENT: No blood in posterior pharynx, Neck: No crepitus  Musculoskeletal: No deformity  Skin: warm  Procedures  INTUBATION Performed by: Lavonia Drafts Required items: required blood products, implants, devices, and special equipment available Patient identity confirmed: provided demographic data and hospital-assigned identification number Time out: Immediately prior to procedure a "time out" was called to verify the correct patient, procedure, equipment, support staff and site/side marked as required. Indications: Unresponsive-arrest Intubation method: Glide scope Preoxygenation: BVM Sedatives: None Paralytic: None Tube Size: 7.5 cuffed Post-procedure assessment: chest rise and ETCO2  monitor Breath sounds: equal and absent over the epigastrium Tube secured by Respiratory Therapy Patient tolerated the procedure well with no immediate complications.  CRITICAL CARE Performed by: Lavonia Drafts Total critical care time: 15 Critical care time was exclusive of separately billable procedures and treating other patients. Critical care was necessary to treat or prevent imminent or life-threatening deterioration. Critical care was time spent personally by me on the following activities: development of treatment plan with patient and/or surrogate as well as nursing, discussions with consultants, evaluation of patient's response to treatment, examination of patient, obtaining history from patient or surrogate, ordering and performing treatments and interventions, ordering and review of laboratory studies, ordering and  review of radiographic studies, pulse oximetry and re-evaluation of patient's condition.  Cardiopulmonary Resuscitation (CPR) Procedure Note  Directed/Performed by: Lavonia Drafts I personally directed ancillary staff and/or performed CPR in an effort to regain return of spontaneous circulation and to maintain cardiac, neuro and systemic perfusion.    Medical Decision making  Patient intubated by me, 1 round of epinephrine with ROSC.  Dr. Fritzi Mandes of hospitalist service arranging transfer to ICU  Assessment and Plan  Care transferred to Dr. Posey Pronto with hospitalist service    Lavonia Drafts, MD 11-Dec-2017 [phone]

## 2017-12-01 NOTE — Progress Notes (Signed)
Code Blue initiated on patient. Happened while SWOT nurse was doing admission profile on patient. Patient transferred to ICU.

## 2017-12-01 NOTE — Progress Notes (Signed)
   2017-12-17 1600  Clinical Encounter Type  Visited With Family;Patient not available  Visit Type Initial;Psychological support;Spiritual support;Social support;Patient actively dying;Critical Care;Death  Referral From Nurse  Spiritual Encounters  Spiritual Needs Grief support;Emotional;Prayer  [place] responded to Code Blue; [place] with family to offer spiritual, grief and emotional support along with prayer during code blues and subsequent patient death.

## 2017-12-01 DEATH — deceased

## 2017-12-02 ENCOUNTER — Telehealth: Payer: Self-pay

## 2017-12-02 NOTE — Telephone Encounter (Signed)
Death Certificate received and placed in doctors box for signing.

## 2017-12-02 NOTE — Telephone Encounter (Signed)
Recieved Death Certificate from ______[facility] ____ Delivered/Placed __nurse box __________

## 2017-12-03 LAB — CULTURE, BLOOD (ROUTINE X 2)
CULTURE: NO GROWTH
Culture: NO GROWTH

## 2017-12-03 NOTE — Telephone Encounter (Signed)
Funeral home contacted.

## 2018-02-01 NOTE — Death Summary Note (Addendum)
[facility] SUMMARY   Patient Details  Name: Ruben Flores MRN: [id number] DOB: Apr 20, 1935  Admission/Discharge Information   Admit Date:  12/08/17  Date of Death: Date of Death: 2017/12/08  Time of Death: Time of Death: Apr 05, 1625  Length of Stay: 1  Referring Physician: Derinda Late, MD   Reason(s) for Hospitalization  Admitted for acute cardiac arrest and resp failure Presented with pneumonia-suffered cardiac arrest 2 more episodes of cardiac arrest while in intensive care unit, witnessed by cardiology (Dr. Clayborn Bigness) at the bedside.  After he spoke with the family family decided to change CODE STATUS to DNR and patient expired.  Family was notified at the bedside  Diagnoses  Preliminary cause of death: pneumonia Secondary Diagnoses (including complications and co-morbidities):  Active Problems:   Sepsis ([place])   Cardiac arrest ([place])      Pertinent Labs and Studies  Significant Diagnostic Studies No results found.  Microbiology No results found for this or any previous visit (from the past 240 hour(s)).  Lab Basic Metabolic Panel: No results for input(s): NA, K, CL, CO2, GLUCOSE, BUN, CREATININE, CALCIUM, MG, PHOS in the last 168 hours. Liver Function Tests: No results for input(s): AST, ALT, ALKPHOS, BILITOT, PROT, ALBUMIN in the last 168 hours. No results for input(s): LIPASE, AMYLASE in the last 168 hours. No results for input(s): AMMONIA in the last 168 hours. CBC: No results for input(s): WBC, NEUTROABS, HGB, HCT, MCV, PLT in the last 168 hours. Cardiac Enzymes: No results for input(s): CKTOTAL, CKMB, CKMBINDEX, TROPONINI in the last 168 hours. Sepsis Labs: No results for input(s): PROCALCITON, WBC, LATICACIDVEN in the last 168 hours.    Flora Lipps 01/07/2018, 4:56 PM

## 2019-07-11 IMAGING — CT CT CHEST W/O CM
2 of 3 series · 15 of 36 positions shown, 18 images · non-contrast
Comparison: Chest CTA 09/10/2017.

CLINICAL DATA: 81-year-old male status post fall this morning.
Midline chest pain.

EXAM:
CT CHEST WITHOUT CONTRAST
TECHNIQUE: Multidetector CT imaging of the chest was performed following the
standard protocol without IV contrast.

[Series 2: thorax · axial · 0.65mm/px · z∈[-360,-82]mm · 12 of 165 slices shown, 15 images]
[im 13/165  mediastinal]
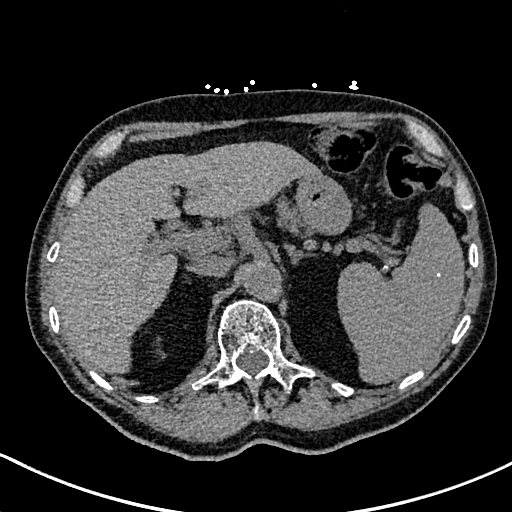
[im 13/165  lung]
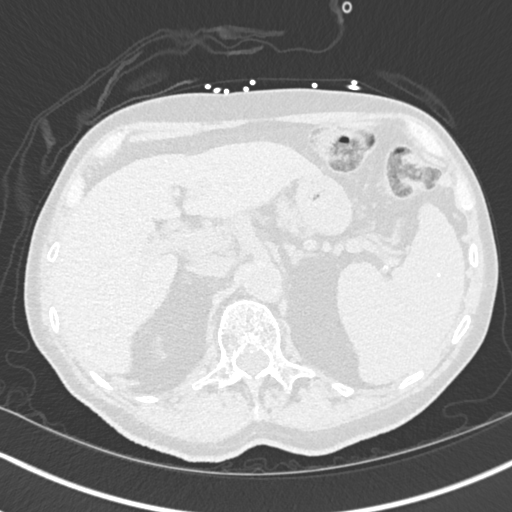
[im 25/165  lung]
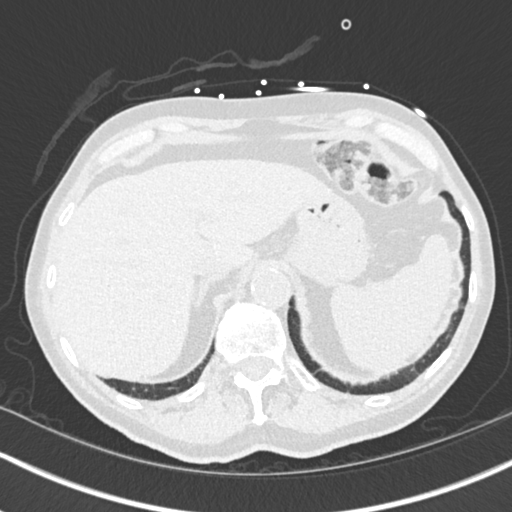
[im 37/165  lung]
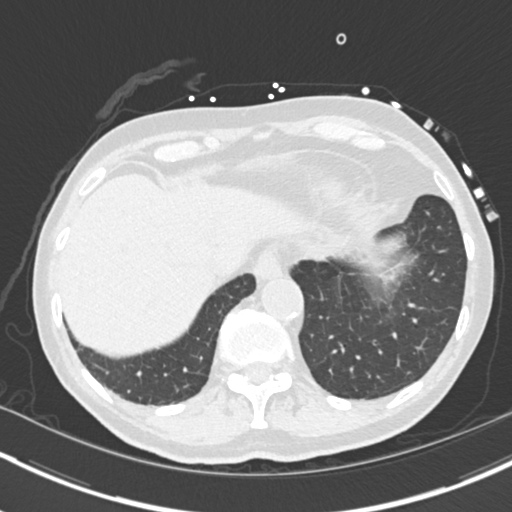
[im 49/165  lung]
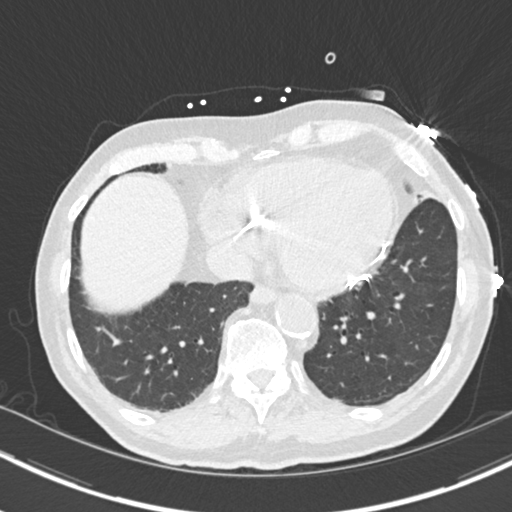
[im 61/165  mediastinal]
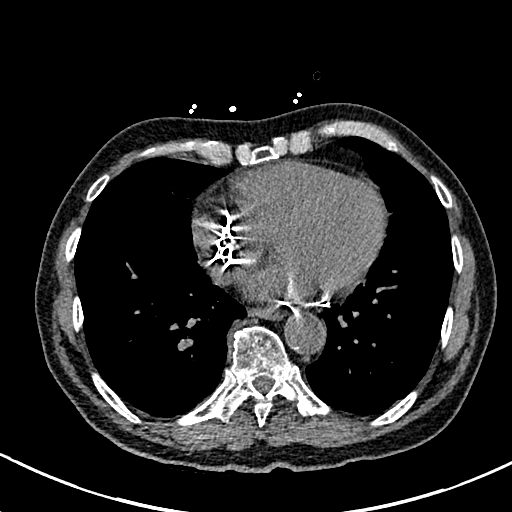
[im 61/165  lung]
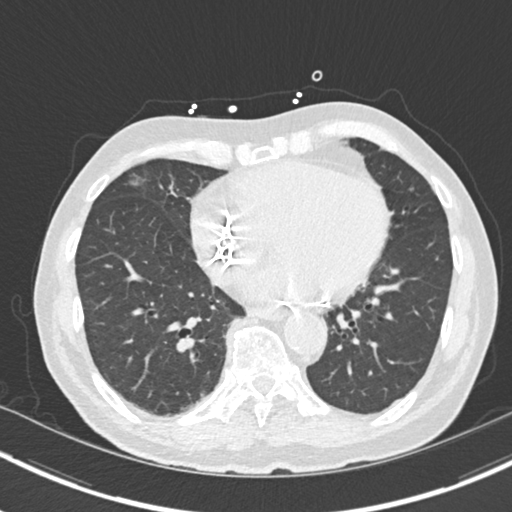
[im 73/165  lung]
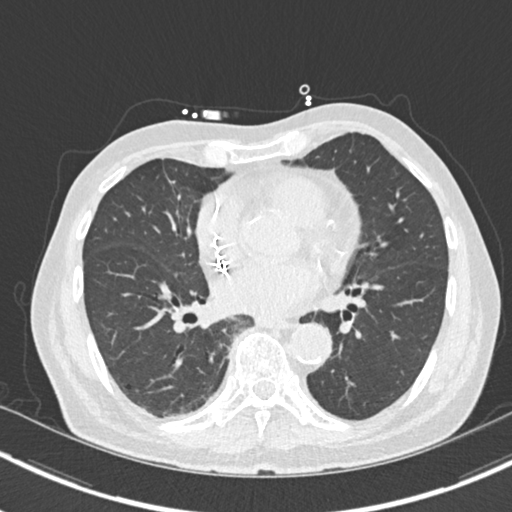
[im 92/165  lung]
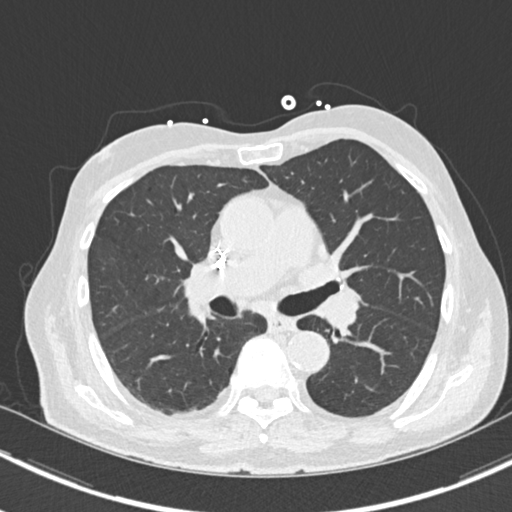
[im 104/165  lung]
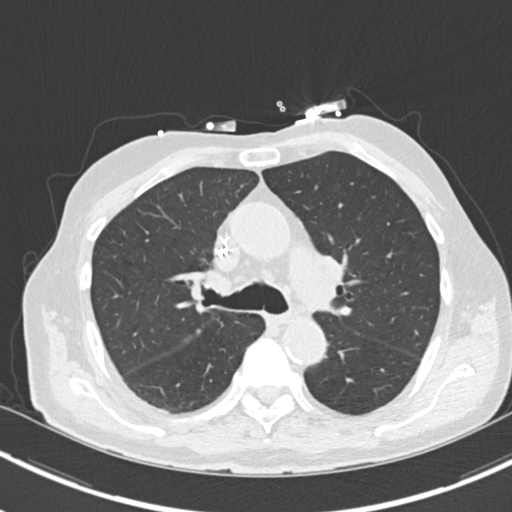
[im 116/165  mediastinal]
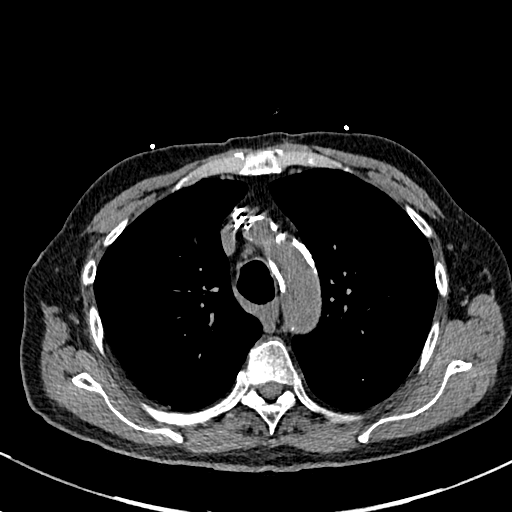
[im 116/165  lung]
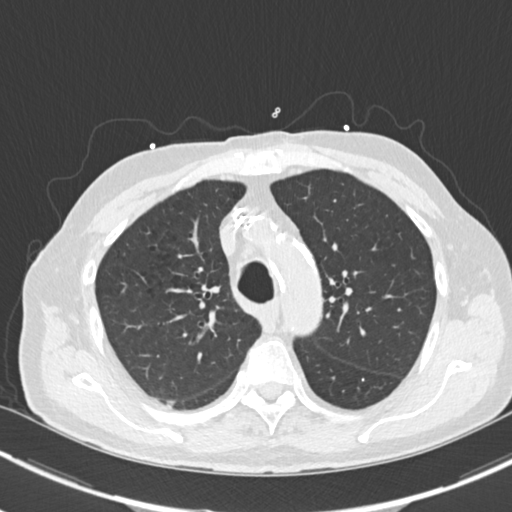
[im 128/165  lung]
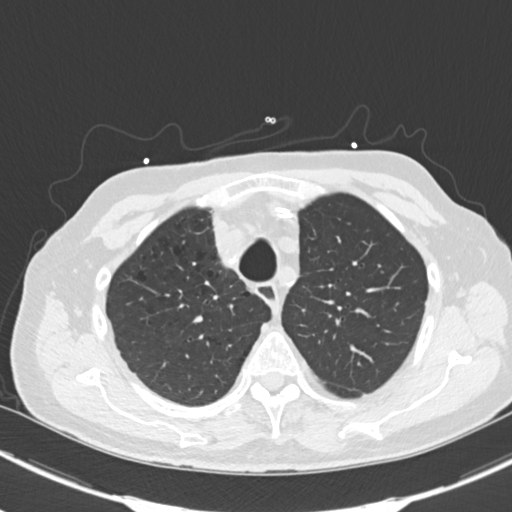
[im 140/165  lung]
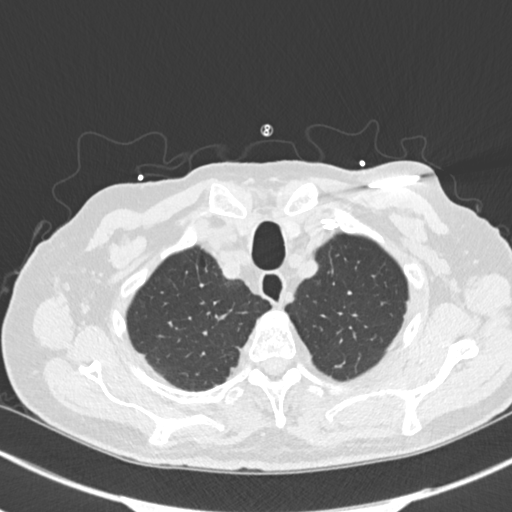
[im 152/165  lung]
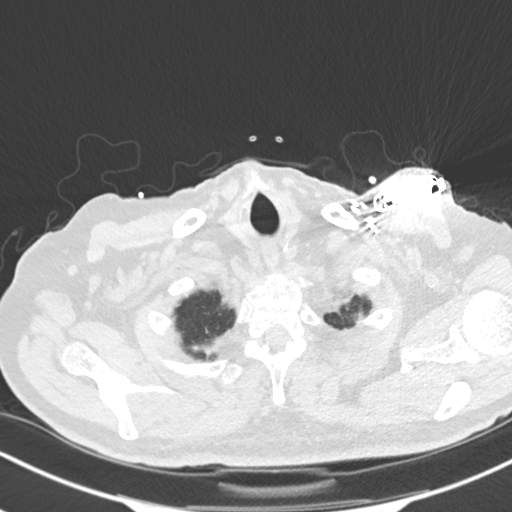

[Series 5: coronal · coronal · 0.64mm/px · 3 of 116 slices shown]
[im 24/116  lung]
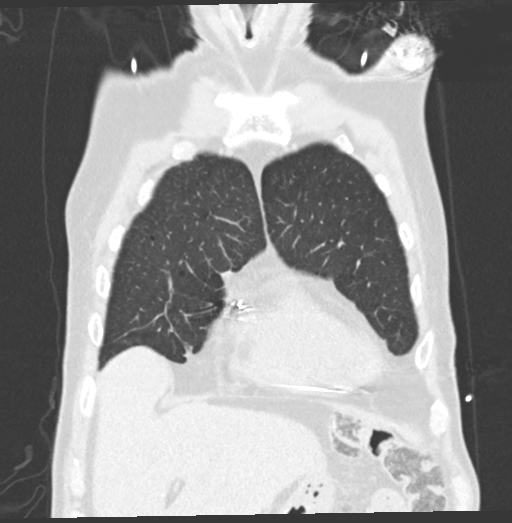
[im 47/116  lung]
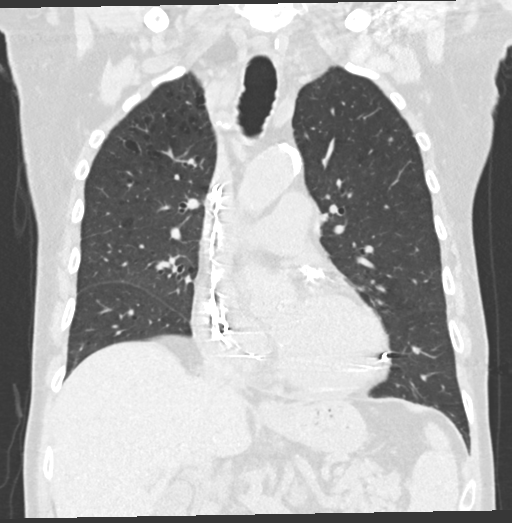
[im 70/116  lung]
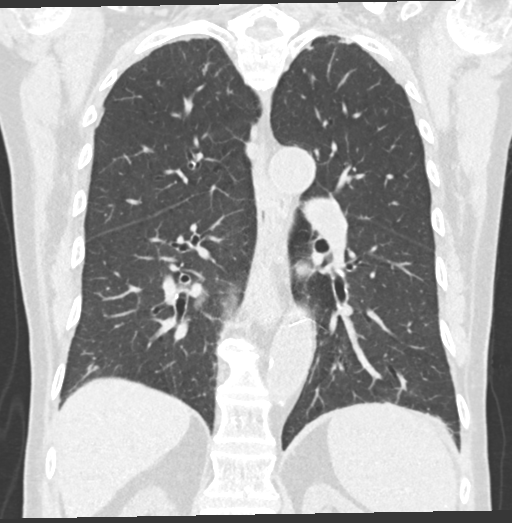

[15 of 36 positions shown; findings below may reference images not displayed]

FINDINGS: Cardiovascular: Chronic Calcified aortic atherosclerosis and
calcified coronary artery atherosclerosis. Vascular patency is not
evaluated in the absence of IV contrast. Chronic left chest cardiac
pacemaker device. Stable cardiac size at the upper limits of normal.
No pericardial effusion.

Mediastinum/Nodes: Negative, no lymphadenopathy. Negative
noncontrast thoracic inlet aside from left CCA calcified
atherosclerosis.

Lungs/Pleura: Larger lung volumes. Major airways are patent. No
pneumothorax or pleural effusion. Upper lobe centrilobular emphysema
more apparent on the right. Occasional tiny calcified granulomas in
both lungs. Intermittent mild subpleural scarring. No pulmonary
contusion or other abnormal pulmonary opacity.

Upper Abdomen: Negative visible noncontrast liver, gallbladder,
pancreas, adrenal glands, kidneys, and bowel in the upper abdomen.
Splenic size is stable at the upper limits of normal. There are
scattered small calcified splenic granulomas.

Musculoskeletal: No rib fracture identified. Intact sternum. No
acute osseous abnormality identified.

No superficial soft tissue injury identified.
IMPRESSION: 1.  No acute traumatic injury identified in the chest.
2. Aortic Atherosclerosis (COFUS-73A.A) and Emphysema ([id number]).
